# Patient Record
Sex: Female | Born: 1950
Health system: Southern US, Community
[De-identification: ages and names within clinical notes are randomized; demographics above are authoritative.]

## PROBLEM LIST (undated history)

## (undated) DIAGNOSIS — C801 Malignant (primary) neoplasm, unspecified: Secondary | ICD-10-CM

## (undated) DIAGNOSIS — G43909 Migraine, unspecified, not intractable, without status migrainosus: Secondary | ICD-10-CM

## (undated) DIAGNOSIS — T7840XA Allergy, unspecified, initial encounter: Secondary | ICD-10-CM

## (undated) DIAGNOSIS — M8580 Other specified disorders of bone density and structure, unspecified site: Secondary | ICD-10-CM

## (undated) DIAGNOSIS — Z85828 Personal history of other malignant neoplasm of skin: Secondary | ICD-10-CM

## (undated) DIAGNOSIS — N200 Calculus of kidney: Secondary | ICD-10-CM

## (undated) DIAGNOSIS — F0280 Dementia in other diseases classified elsewhere without behavioral disturbance: Secondary | ICD-10-CM

## (undated) HISTORY — DX: Migraine, unspecified, not intractable, without status migrainosus: G43.909

## (undated) HISTORY — DX: Dementia in other diseases classified elsewhere, unspecified severity, without behavioral disturbance, psychotic disturbance, mood disturbance, and anxiety: F02.80

## (undated) HISTORY — DX: Malignant (primary) neoplasm, unspecified: C80.1

## (undated) HISTORY — DX: Personal history of other malignant neoplasm of skin: Z85.828

## (undated) HISTORY — PX: COLONOSCOPY: SHX174

## (undated) HISTORY — DX: Calculus of kidney: N20.0

## (undated) HISTORY — DX: Allergy, unspecified, initial encounter: T78.40XA

## (undated) HISTORY — DX: Other specified disorders of bone density and structure, unspecified site: M85.80

---
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
## 2002-01-23 ENCOUNTER — Encounter: Payer: Self-pay | Admitting: Obstetrics and Gynecology

## 2002-01-23 ENCOUNTER — Encounter: Admission: RE | Admit: 2002-01-23 | Discharge: 2002-01-23 | Payer: Self-pay | Admitting: Obstetrics and Gynecology

## 2002-03-12 ENCOUNTER — Encounter: Admission: RE | Admit: 2002-03-12 | Discharge: 2002-03-12 | Payer: Self-pay | Admitting: Obstetrics and Gynecology

## 2002-03-12 ENCOUNTER — Encounter: Payer: Self-pay | Admitting: Obstetrics and Gynecology

## 2002-04-22 ENCOUNTER — Ambulatory Visit (HOSPITAL_COMMUNITY): Admission: RE | Admit: 2002-04-22 | Discharge: 2002-04-22 | Payer: Self-pay | Admitting: Obstetrics and Gynecology

## 2002-04-22 ENCOUNTER — Encounter (INDEPENDENT_AMBULATORY_CARE_PROVIDER_SITE_OTHER): Payer: Self-pay | Admitting: *Deleted

## 2003-02-09 ENCOUNTER — Other Ambulatory Visit: Admission: RE | Admit: 2003-02-09 | Discharge: 2003-02-09 | Payer: Self-pay | Admitting: Obstetrics and Gynecology

## 2003-06-01 ENCOUNTER — Encounter: Admission: RE | Admit: 2003-06-01 | Discharge: 2003-06-01 | Payer: Self-pay | Admitting: Internal Medicine

## 2004-02-16 ENCOUNTER — Other Ambulatory Visit: Admission: RE | Admit: 2004-02-16 | Discharge: 2004-02-16 | Payer: Self-pay | Admitting: Obstetrics and Gynecology

## 2005-03-09 ENCOUNTER — Other Ambulatory Visit: Admission: RE | Admit: 2005-03-09 | Discharge: 2005-03-09 | Payer: Self-pay | Admitting: Obstetrics and Gynecology

## 2005-08-01 ENCOUNTER — Encounter: Admission: RE | Admit: 2005-08-01 | Discharge: 2005-08-01 | Payer: Self-pay | Admitting: Internal Medicine

## 2006-03-15 ENCOUNTER — Other Ambulatory Visit: Admission: RE | Admit: 2006-03-15 | Discharge: 2006-03-15 | Payer: Self-pay | Admitting: Obstetrics and Gynecology

## 2006-07-31 ENCOUNTER — Emergency Department (HOSPITAL_COMMUNITY): Admission: EM | Admit: 2006-07-31 | Discharge: 2006-07-31 | Payer: Self-pay | Admitting: Emergency Medicine

## 2006-09-19 ENCOUNTER — Encounter: Admission: RE | Admit: 2006-09-19 | Discharge: 2006-09-19 | Payer: Self-pay | Admitting: Obstetrics and Gynecology

## 2007-03-26 ENCOUNTER — Other Ambulatory Visit: Admission: RE | Admit: 2007-03-26 | Discharge: 2007-03-26 | Payer: Self-pay | Admitting: Obstetrics and Gynecology

## 2007-08-19 LAB — HM COLONOSCOPY

## 2007-11-07 ENCOUNTER — Encounter: Admission: RE | Admit: 2007-11-07 | Discharge: 2007-11-07 | Payer: Self-pay | Admitting: Obstetrics and Gynecology

## 2008-03-30 ENCOUNTER — Other Ambulatory Visit: Admission: RE | Admit: 2008-03-30 | Discharge: 2008-03-30 | Payer: Self-pay | Admitting: Obstetrics and Gynecology

## 2009-02-22 ENCOUNTER — Encounter: Admission: RE | Admit: 2009-02-22 | Discharge: 2009-02-22 | Payer: Self-pay | Admitting: Obstetrics and Gynecology

## 2010-04-20 ENCOUNTER — Encounter: Admission: RE | Admit: 2010-04-20 | Discharge: 2010-04-20 | Payer: Self-pay | Admitting: Obstetrics and Gynecology

## 2010-10-28 NOTE — Op Note (Signed)
NAME:  Joanne Brock, Joanne Brock                         ACCOUNT NO.:  [id number]   MEDICAL RECORD NO.:  [id number]                   PATIENT TYPE:  AMB   LOCATION:  SDC                                  FACILITY:  [facility]   PHYSICIAN:  Artist Pais, M.D.                 DATE OF BIRTH:  05-01-1951   DATE OF PROCEDURE:  04/22/2002  DATE OF DISCHARGE:                                 OPERATIVE REPORT   PREOPERATIVE DIAGNOSES:  1. Postmenopausal bleeding.  2. Endometrial polyps on sonohysterogram.   POSTOPERATIVE DIAGNOSES:  1. Postmenopausal bleeding.  2. Endometrial polyps on sonohysterogram.   PROCEDURE:  1. Dilatation and curettage.  2. Attempted hysteroscopy.  3. Polypectomy.   SURGEON:  Artist Pais, M.D.   ESTIMATED BLOOD LOSS:  Minimal.   ANESTHESIA:  MAC plus 10 cc 1% lidocaine paracervical block.   COMPLICATIONS:  None.   DRAINS:  None.   DESCRIPTION OF OPERATION:  The patient was brought to the operating room,  identified on the operating room table.  After the patient was adequately  sedated using monitored anesthesia care, she was placed in the dorsal  lithotomy position and prepped and draped in the usual sterile fashion.  The  bladder was straight catheterized for approximately 150 cc of clear yellow  urine.  Examination under anesthesia revealed the uterus to be slightly  retroverted, normal size, approximately 6 weeks without any adnexal mass  palpated.  The speculum was placed and the posterior lip of the cervix was  grasped with a single tooth tenaculum after infiltrating it with 1 cc of 1%  lidocaine.  The remaining 9 cc of 1% lidocaine were placed for a  paracervical block.  However, even though the uterus was slightly  retroverted, the endometrial canal tracked in the anteverted fashion and I  was able to easily dilate the patient to a number 23 Pratt dilator.  Subsequent to that, though, I was unable to place the [facility] hysteroscope and  then again dilated up to  a number 25 Pratt dilator.  However, the [facility]I  hysteroscope kept catching on a ridge of cervical tissue on the right and  after several attempts it was deemed by this physician to be unsafe to  continue to try to place the scope despite the fact that the cervix was  adequately dilated.  The scope did continue to catch on this particular  small cervical ridge and I was unable to flatten out this ridge at all.  Thus, the decision was made to discontinue the hysteroscopy attempt and to  curette the uterus.  The uterus was subsequently curetted in a systematic  clockwise fashion with tissue obtained.  I was able to obtain tissue  throughout the entire uterus and at the end a good cry was heard all around.  The tissue was sent to pathology for examination.  At that point the  procedure was then terminated.  The patient tolerated procedure well without  apparent complications and was transferred to the recovery room in stable  condition after all instrument, sponge, needle counts were correct.  The  patient was noted to not be bleeding from the tenaculum site or from the  cervix lidocaine paracervical block sites.  She was subsequently given a  postoperative instruction sheet, urged to take Tylenol or ibuprofen for  pain, to return to the office in two to three weeks for a postoperative  evaluation.  She is to call with any problems.                                               Artist Pais, M.D.    DC/MEDQ  D:  04/22/2002  T:  04/22/2002  Job:  [id number]   cc:   Theressa Millard, M.D.  [address] [place]  [place] [address]  Fax: [phone]

## 2010-10-28 NOTE — H&P (Signed)
NAME:  Joanne Brock, Joanne Brock                         ACCOUNT NO.:  [id number]   MEDICAL RECORD NO.:  [id number]                   PATIENT TYPE:  AMB   LOCATION:  SDC                                  FACILITY:  [facility]   PHYSICIAN:  Artist Pais, M.D.                 DATE OF BIRTH:  23-Aug-1950   DATE OF ADMISSION:  DATE OF DISCHARGE:                                HISTORY & PHYSICAL   HISTORY OF PRESENT ILLNESS:  The patient is a very nice 61 year old gravida  1, para 1, Caucasian female referred via the courtesy of Dr. Theressa Millard.  The patient was initially seen January 09, 2002. She had been taking CombiPatch  for hormone replacement therapy but manifested bleeding on her CombiPatch  every three months for the last several years. Subsequently due to her  postmenopausal bleeding, she underwent a pelvic ultrasound which showed a  focal area of fundal thickening up to 12 mm in length toward the fundal  region. The remainder of the endometrium appeared to be very thin. The  patient subsequently underwent  sonohysterogram because the question is  raised of a small endometrial polyp on ultrasound. Sonohysterogram showed a  focal endometrial polyp along the posterior wall in the lower uterine  segment measuring 5 x 2 mm. In addition, there was a plaquelike area of a  symmetric thickening involving the endometrium along the anterior uterine  wall measuring 3 mm in thickness. The patient was advised to undergo D&C  hysteroscopy to remove these polyps. Risks of surgery including anesthetic  complications, hemorrhage, infection, damage to adjacent structures  including bladder, bowel, blood vessels and ureters were discussed with the  patient and she was made aware of the risk in uterine perforation which  could result in overwhelming life threatening hemorrhage requiring emergent  hysterectomy or uterine perforation which could result in bowel damage  requiring emergent colostomy which could result in  overwhelming life  threatening peritonitis. She expresses understanding of and acceptance of  these risks and desires to proceed with surgery.   OB/GYN HISTORY:  Menarche age 41, menopause at the age of 87, no history of  abnormal Pap or sexually transmitted disease.   PAST MEDICAL HISTORY:  History of migraine headaches initially moderated by  the CombiPatch but migraines have recurred recently.   ALLERGIES:  No known drug allergies.   CURRENT MEDICATIONS:  CombiPatch.   PAST SURGICAL HISTORY:  None.   FAMILY HISTORY:  There is no family history of colon, breast, ovarian or  prostate cancer. The patient's mother is 80 with skin cancer and  hypertension. She has some type of dementia and memory impairment thought to  be due to Alzheimer's and mini strokes. Her father died at 86 with senile  dementia. One sister age 80, alive and well. One child age 67, alive and  well.   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 She  does not use tobacco or alcohol.   REVIEW OF SYMPTOMS:  Noncontributory except as noted above. Denies headache,  visual changes, chest pain, shortness of breath, abdominal pain, change in  bowel habits, unintentional weight loss, dysuria, urgency, frequency,  vaginal pruritus or discharge, pain or bleeding with intercourse.   PHYSICAL EXAMINATION:  GENERAL:  A well-developed Caucasian female.  VITAL SIGNS:  Blood pressure 106/70, heart rate 76, weight 137, height 5  feet, 3 3/4 inches.  HEENT:  Normal.  NECK:  Supple without thyromegaly, adenopathy or nodules.  CHEST:  Clear to auscultation.  BREASTS:  Symmetrical without masses, nodes, dimpling, retraction or nipple  discharge.  CARDIAC:  Regular rate and rhythm without obvious murmur.  ABDOMEN:  Soft, nontender. No hepatosplenomegaly or masses.  EXTREMITIES:  No cyanosis, clubbing or edema.  NEUROLOGIC:  Oriented x 3, grossly normal.  PELVIC:  Normal external female genitalia. No  vulvar, vaginal or cervical  lesions. Pap smear with cyto brush __________ was performed on initial visit  found to be within normal limits. Bimanual examination revealed the uterus  to be normal , mobile, nontender without any adnexal mass palpated.  RECTAL:  Excellent sphincter tone. No masses palpated.   ASSESSMENT/PLAN:  The patient is a 60 year old Caucasian female, gravida 1,  para 1 with postmenopausal bleeding on workup who was found to have  plaquelike thickening on the anterior uterine wall and also an endometrial  polyp on the posterior wall. She is admitted for a D&C hysteroscopy and  polypectomy. Risks have been discussed with the patient and she expresses  understanding of and acceptance of these risks and desires to proceed with  surgery.                                               Artist Pais, M.D.    DC/MEDQ  D:  04/21/2002  T:  04/22/2002  Job:  [id number]   cc:   Theressa Millard, M.D.  [address] [place]  [place] [address]  Fax: [phone]   Day Surgery at [facility]

## 2011-03-14 ENCOUNTER — Other Ambulatory Visit: Payer: Self-pay | Admitting: Obstetrics and Gynecology

## 2011-03-14 DIAGNOSIS — Z1231 Encounter for screening mammogram for malignant neoplasm of breast: Secondary | ICD-10-CM

## 2011-04-24 ENCOUNTER — Ambulatory Visit: Payer: Self-pay

## 2011-04-27 ENCOUNTER — Ambulatory Visit
Admission: RE | Admit: 2011-04-27 | Discharge: 2011-04-27 | Disposition: A | Discharge status: Home / Self Care | Payer: BC Managed Care – PPO | Source: Ambulatory Visit | Attending: Obstetrics and Gynecology | Admitting: Obstetrics and Gynecology

## 2011-04-27 DIAGNOSIS — Z1231 Encounter for screening mammogram for malignant neoplasm of breast: Secondary | ICD-10-CM

## 2012-03-19 ENCOUNTER — Other Ambulatory Visit: Payer: Self-pay | Admitting: Obstetrics and Gynecology

## 2012-03-19 DIAGNOSIS — Z1231 Encounter for screening mammogram for malignant neoplasm of breast: Secondary | ICD-10-CM

## 2012-05-02 ENCOUNTER — Ambulatory Visit
Admission: RE | Admit: 2012-05-02 | Discharge: 2012-05-02 | Disposition: A | Discharge status: Home / Self Care | Payer: BC Managed Care – PPO | Source: Ambulatory Visit | Attending: Obstetrics and Gynecology | Admitting: Obstetrics and Gynecology

## 2012-05-02 DIAGNOSIS — Z1231 Encounter for screening mammogram for malignant neoplasm of breast: Secondary | ICD-10-CM

## 2013-04-18 ENCOUNTER — Other Ambulatory Visit: Payer: Self-pay

## 2013-04-18 DIAGNOSIS — Z1231 Encounter for screening mammogram for malignant neoplasm of breast: Secondary | ICD-10-CM

## 2013-05-19 ENCOUNTER — Ambulatory Visit
Admission: RE | Admit: 2013-05-19 | Discharge: 2013-05-19 | Disposition: A | Discharge status: Home / Self Care | Payer: BC Managed Care – PPO | Source: Ambulatory Visit

## 2013-05-19 DIAGNOSIS — Z1231 Encounter for screening mammogram for malignant neoplasm of breast: Secondary | ICD-10-CM

## 2013-05-20 ENCOUNTER — Ambulatory Visit: Payer: BC Managed Care – PPO

## 2014-03-24 ENCOUNTER — Ambulatory Visit (INDEPENDENT_AMBULATORY_CARE_PROVIDER_SITE_OTHER): Payer: BC Managed Care – PPO

## 2014-03-24 VITALS — BP 112/68 | HR 74 | Resp 17

## 2014-03-24 DIAGNOSIS — B351 Tinea unguium: Secondary | ICD-10-CM

## 2014-03-24 DIAGNOSIS — L603 Nail dystrophy: Secondary | ICD-10-CM

## 2014-03-24 DIAGNOSIS — M79675 Pain in left toe(s): Secondary | ICD-10-CM

## 2014-03-24 MED ORDER — CEPHALEXIN 500 MG PO CAPS: 500.0000 mg | ORAL_CAPSULE | Freq: Three times a day (TID) | ORAL | Status: DC

## 2014-03-24 NOTE — Patient Instructions (Addendum)
Betadine Soak Instructions  Purchase an 8 oz. bottle of BETADINE solution (Povidone)  THE DAY AFTER THE PROCEDURE  Place 1 tablespoon of betadine solution in a quart of warm tap water.  Submerge your foot or feet with outer bandage intact for the initial soak; this will allow the bandage to become moist and wet for easy lift off.  Once you remove your bandage, continue to soak in the solution for 20 minutes.  This soak should be done twice a day.  Next, remove your foot or feet from solution, blot dry the affected area and cover.  You may use a band aid large enough to cover the area or use gauze and tape.  Apply other medications to the area as directed by the doctor such as cortisporin otic solution (ear drops) or neosporin.  IF YOUR SKIN BECOMES IRRITATED WHILE USING THESE INSTRUCTIONS, IT IS OKAY TO SWITCH TO EPSOM SALTS AND WATER OR WHITE VINEGAR AND WATER.  For postoperative pain recommend 2 Tylenol 3 times daily as needed for pain or Aleve twice daily as needed for pain

## 2014-03-24 NOTE — Progress Notes (Signed)
   Subjective:    Patient ID: Joanne Brock, female    DOB: May 20, 1951, 63 y.o.   MRN: [id number]  HPI  Pt presents with great left toenail fungus, this has been an ongoing problem for her, she has tried medications with no results. Wants to have nail permenantly removed  Review of Systems  All other systems reviewed and are negative.      Objective:   Physical Exam 63 year old white female well-developed well-nourished oriented x3 presents at this time requesting permanent nail excision left great toe she's had multiple treatments times for fungus nails as a trauma to the nail beds and the inserts nails thick criptotic discolored yellowed and brittle incurvated in ingrowing and tender with enclosed shoes and activities patient wished to have it removed permanently. Neurovascular status is intact pedal pulses are palpable DP and PT +2/4 bilateral capillary refill time 3 seconds. Epicritic and proprioceptive sensations intact and symmetric bilateral. Neurologically epicritic and proprioceptive sensations intact normal plantar response and DTRs noted. Dermatologically skin color pigment normal hair growth absent diminished distally nails normal except for the left hallux which is thick and mycotic brittle crumbly and friable incurvated tender on palpation. No secondary infection no discharge or drainage no purulence or no pain on palpation and direct compression is noted. Remainder of exam unremarkable rectus foot mild digital contractures flexible nature no open wounds or ulcers no secondary infections       Assessment & Plan:  Assessment onychomycosis the nail dystrophy and pain of the left hallux nail plate and nailbed plan at this time per patient request my recommendation permanent nail excision and phenol matricectomy local block is administered Betadine prep performed the nail plate is avulsed feel application x3 of alcohol wash Silvadene cream and gauze dressing being applied. Patient is  given instructions for daily soap and Betadine or Epsom salts Rickman Tylenol or ibuprofen as needed for pain also recheck in 2-3 weeks for followup for nail check. Patient placed on a regimen of antibiotics cephalexin 500 mg 3 times a day x7 days begin Neosporin and Band-Aid dressings daily as instructed next progress Joanne Brock DPM

## 2014-04-03 ENCOUNTER — Telehealth: Payer: Self-pay | Admitting: *Deleted

## 2014-04-03 NOTE — Telephone Encounter (Signed)
Toe seems to be healing just fine the outer edges of the toe seem to be good, but noticed on the bandaid today in the middle of the toe seems to have blood on it , is it normal? Spoke to patient told her that was normal as long as her toe did not look inflamed red, green or yellow pus. No heat to the toe. Patient will continue to soak the toe until she follows up with dr Blenda Mounts

## 2014-04-14 ENCOUNTER — Ambulatory Visit (INDEPENDENT_AMBULATORY_CARE_PROVIDER_SITE_OTHER): Payer: BC Managed Care – PPO

## 2014-04-14 VITALS — BP 112/65 | HR 66 | Resp 13

## 2014-04-14 DIAGNOSIS — B351 Tinea unguium: Secondary | ICD-10-CM

## 2014-04-14 DIAGNOSIS — Z09 Encounter for follow-up examination after completed treatment for conditions other than malignant neoplasm: Secondary | ICD-10-CM

## 2014-04-14 DIAGNOSIS — M79675 Pain in left toe(s): Secondary | ICD-10-CM

## 2014-04-14 NOTE — Patient Instructions (Signed)

## 2014-04-14 NOTE — Progress Notes (Signed)
   Subjective:    Patient ID: OAKLEY ORBAN, female    DOB: 08/19/1950, 63 y.o.   MRN: [id number]  HPI Comments: Pt states she had bleeding up until yesterday, but now only oozing.  Pt states she has switched to white vinegar 1/4 C to 1 Qt of water.     Review of Systemsno new findings or systemic changes noted     Objective:   Physical Exam Neurovascular status is intact pedal pulses are palpable patient been soaking switched over to vinegar from Betadine there is still was a little bleeding up until yellowed day still mild serous drainage present nail bed clean serous drainage noted is no malodor no ascending psoas with meningitis no signs of infection no signs of regrowth her spicule of nail      Assessment & Plan:  Assessment good postop progress following AP nail procedure continue with soaking once daily lungs or drainage keep Neosporin and Band-Aid on during daily or dry at night discharge to an as-needed basis for follow-up does not resolve within a month next  [employer] DPM

## 2015-06-08 ENCOUNTER — Other Ambulatory Visit (HOSPITAL_COMMUNITY): Payer: Self-pay | Admitting: Otolaryngology

## 2015-06-08 DIAGNOSIS — H93A3 Pulsatile tinnitus, bilateral: Secondary | ICD-10-CM

## 2015-06-10 ENCOUNTER — Other Ambulatory Visit (HOSPITAL_COMMUNITY): Payer: Self-pay | Admitting: Otolaryngology

## 2015-06-10 DIAGNOSIS — R011 Cardiac murmur, unspecified: Secondary | ICD-10-CM

## 2015-06-11 ENCOUNTER — Ambulatory Visit (HOSPITAL_COMMUNITY)
Admission: RE | Admit: 2015-06-11 | Discharge: 2015-06-11 | Disposition: A | Discharge status: Home / Self Care | Payer: BLUE CROSS/BLUE SHIELD | Source: Ambulatory Visit | Attending: Cardiovascular Disease | Admitting: Cardiovascular Disease

## 2015-06-11 ENCOUNTER — Ambulatory Visit (HOSPITAL_BASED_OUTPATIENT_CLINIC_OR_DEPARTMENT_OTHER)
Admission: RE | Admit: 2015-06-11 | Discharge: 2015-06-11 | Disposition: A | Discharge status: Home / Self Care | Payer: BLUE CROSS/BLUE SHIELD | Source: Ambulatory Visit | Attending: Cardiovascular Disease | Admitting: Cardiovascular Disease

## 2015-06-11 DIAGNOSIS — I071 Rheumatic tricuspid insufficiency: Secondary | ICD-10-CM | POA: Diagnosis not present

## 2015-06-11 DIAGNOSIS — I6523 Occlusion and stenosis of bilateral carotid arteries: Secondary | ICD-10-CM | POA: Diagnosis not present

## 2015-06-11 DIAGNOSIS — R011 Cardiac murmur, unspecified: Secondary | ICD-10-CM

## 2015-06-11 DIAGNOSIS — I340 Nonrheumatic mitral (valve) insufficiency: Secondary | ICD-10-CM | POA: Insufficient documentation

## 2015-06-11 DIAGNOSIS — H93A3 Pulsatile tinnitus, bilateral: Secondary | ICD-10-CM | POA: Diagnosis not present

## 2016-04-11 LAB — LIPID PANEL
Cholesterol: 245 — AB (ref 0–200)
Cholesterol: 4 (ref 0–200)
HDL: 67 (ref 35–70)
HDL: 67 (ref 35–70)
LDL Cholesterol: 161
LDL Cholesterol: 161
Triglycerides: 85 (ref 40–160)
Triglycerides: 85 (ref 40–160)

## 2016-04-11 LAB — CBC AND DIFFERENTIAL
HCT: 39 (ref 36–46)
HCT: 39 (ref 36–46)
HEMATOCRIT: 39 (ref 36–46)
Hemoglobin: 13.1 (ref 12.0–16.0)
Hemoglobin: 13.1 (ref 12.0–16.0)
Hemoglobin: 13.1 (ref 12.0–16.0)
PLATELETS: 199 (ref 150–399)
Platelets: 199 (ref 150–399)
Platelets: 199 (ref 150–399)
WBC: 6
WBC: 6
WBC: 6

## 2016-04-11 LAB — BASIC METABOLIC PANEL
BUN: 12 (ref 4–21)
Creatinine: 0.8 (ref 0.5–1.1)
GLUCOSE: 87
POTASSIUM: 3.8 (ref 3.4–5.3)
Sodium: 140 (ref 137–147)

## 2016-04-11 LAB — BASIC METABOLIC PANEL WITH GFR
BUN: 12 (ref 4–21)
Creatinine: 0.8 (ref 0.5–1.1)
Glucose: 87
Potassium: 3.8 (ref 3.4–5.3)
Sodium: 140 (ref 137–147)

## 2016-04-11 LAB — HEPATIC FUNCTION PANEL
ALT: 12 (ref 7–35)
ALT: 12 (ref 7–35)
ALT: 12 (ref 7–35)
AST: 23 (ref 13–35)
AST: 23 (ref 13–35)
AST: 23 (ref 13–35)
Alkaline Phosphatase: 74 (ref 25–125)

## 2016-04-11 LAB — VITAMIN D 25 HYDROXY (VIT D DEFICIENCY, FRACTURES): VIT D 25 HYDROXY: 41.3

## 2016-07-24 LAB — HM PAP SMEAR: HM Pap smear: NEGATIVE

## 2016-07-24 LAB — HM MAMMOGRAPHY

## 2016-07-27 ENCOUNTER — Other Ambulatory Visit: Payer: Self-pay | Admitting: Obstetrics and Gynecology

## 2016-07-27 DIAGNOSIS — N951 Menopausal and female climacteric states: Secondary | ICD-10-CM

## 2016-08-22 ENCOUNTER — Ambulatory Visit
Admission: RE | Admit: 2016-08-22 | Discharge: 2016-08-22 | Disposition: A | Discharge status: Home / Self Care | Payer: BLUE CROSS/BLUE SHIELD | Source: Ambulatory Visit | Attending: Obstetrics and Gynecology | Admitting: Obstetrics and Gynecology

## 2016-08-22 DIAGNOSIS — N951 Menopausal and female climacteric states: Secondary | ICD-10-CM

## 2016-08-22 LAB — HM DEXA SCAN

## 2017-02-08 ENCOUNTER — Encounter: Payer: Self-pay | Admitting: Family Medicine

## 2017-02-08 ENCOUNTER — Ambulatory Visit (INDEPENDENT_AMBULATORY_CARE_PROVIDER_SITE_OTHER): Payer: BLUE CROSS/BLUE SHIELD | Admitting: Family Medicine

## 2017-02-08 VITALS — BP 124/78 | HR 81 | Temp 98.3°F | Ht 63.5 in | Wt 118.8 lb

## 2017-02-08 DIAGNOSIS — E559 Vitamin D deficiency, unspecified: Secondary | ICD-10-CM

## 2017-02-08 DIAGNOSIS — E785 Hyperlipidemia, unspecified: Secondary | ICD-10-CM

## 2017-02-08 DIAGNOSIS — M5412 Radiculopathy, cervical region: Secondary | ICD-10-CM

## 2017-02-08 DIAGNOSIS — Z86010 Personal history of colonic polyps: Secondary | ICD-10-CM

## 2017-02-08 DIAGNOSIS — Z85828 Personal history of other malignant neoplasm of skin: Secondary | ICD-10-CM | POA: Insufficient documentation

## 2017-02-08 DIAGNOSIS — G43909 Migraine, unspecified, not intractable, without status migrainosus: Secondary | ICD-10-CM | POA: Insufficient documentation

## 2017-02-08 DIAGNOSIS — G43109 Migraine with aura, not intractable, without status migrainosus: Secondary | ICD-10-CM

## 2017-02-08 MED ORDER — PREDNISONE 20 MG PO TABS: ORAL_TABLET | ORAL | 0 refills | Status: DC

## 2017-02-08 NOTE — Progress Notes (Addendum)
Phone: [phone]  Subjective:  Patient presents today to establish care.  Prior patient of [facility] Dr. Maxwell Caul has been seen at [facility] by other providers since then. Chief complaint-noted.   See problem oriented charting  The following were reviewed and entered/updated in epic: Past Medical History:  Diagnosis Date  . History of skin cancer    squamous and basal x 8 in 2018. Dr. Renda Rolls. Had been with Dr. Tonia Brooms.   . Kidney stone    x1  . Migraines    Aura- smell, spots. once a month- sumatriptan. topamax 50mg  once a day prophylaxis.    Patient Active Problem List   Diagnosis Date Noted  . Hyperlipidemia 02/08/2017  . History of skin cancer   . Migraines    Past Surgical History:  Procedure Laterality Date  . TONSILLECTOMY Bilateral 1960    Family History  Problem Relation Age of Onset  . Hypertension Mother   . Stroke Mother        TIAs- declined over years and stroke eventually. died 22  . Other Father        decline after ? heat stroke. died around 53  . Stroke Sister        41. nonsmoker.   Marland Kitchen Ulcerative colitis Daughter     Medications- reviewed and updated Current Outpatient Prescriptions  Medication Sig Dispense Refill  . cholecalciferol (VITAMIN D) 1000 UNITS tablet Take 1,000 Units by mouth daily.    . Coenzyme Q10 (CO Q 10) 10 MG CAPS Take 10 mg by mouth.    Vladimir Faster Glyc-Propyl Glyc PF (SYSTANE ULTRA PF) 0.4-0.3 % SOLN Apply 1 drop topically daily.    . rizatriptan (MAXALT) 10 MG tablet Take 10 mg by mouth as needed for migraine. May repeat in 2 hours if needed    . topiramate (TOPAMAX) 50 MG tablet Take 50 mg by mouth 2 (two) times daily.    . predniSONE (DELTASONE) 20 MG tablet Take 1 tablet by mouth daily for 3 days, then 1/2 tablet daily for 4 days 5 tablet 0   No current facility-administered medications for this visit.     Allergies-reviewed and updated No Known Allergies  Social History   Social History  . Marital  status: Married    Spouse name: N/A  . Number of children: N/A  . Years of education: N/A   Social History Main Topics  . Smoking status: Never Smoker  . Smokeless tobacco: Never Used  . Alcohol use No  . Drug use: No  . Sexual activity: Yes   Other Topics Concern  . None   Social History Narrative   Married over 40 years in 2018. 1 daughter 7 years old in 2018. No grandkids- 2 grandpets.    Had to spend great deal of time caring for aging parents- now passed.       Undergrad at [employer].    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 [employer]: travel       ROS--Full ROS was completed Review of Systems  Constitutional: Negative for chills and fever.  HENT: Negative for hearing loss and tinnitus.   Eyes: Negative for blurred vision and double vision.  Respiratory: Negative for cough and hemoptysis.   Cardiovascular: Negative for chest pain and palpitations.  Gastrointestinal: Negative for heartburn and nausea.  Genitourinary: Negative for dysuria and urgency.  Musculoskeletal: Positive for neck pain. Negative for myalgias.  Skin: Negative for itching and rash.  Neurological: Positive for sensory change (numbness/tingling right hand/forearm).  Endo/Heme/Allergies: Negative for polydipsia. Does not bruise/bleed easily.  Psychiatric/Behavioral: Negative for substance abuse and suicidal ideas.   Objective: BP 124/78 (BP Location: Left Arm, Patient Position: Sitting, Cuff Size: Normal)   Pulse 81   Temp 98.3 F (36.8 C) (Oral)   Ht 5' 3.5" (1.613 m)   Wt 118 lb 12.8 oz (53.9 kg)   SpO2 95%   BMI 20.71 kg/m  Gen: NAD, resting comfortably HEENT: Mucous membranes are moist. Oropharynx normal. TM normal. Eyes: sclera and lids normal, PERRLA Neck: no thyromegaly, no cervical lymphadenopathy CV: RRR no murmurs rubs or gallops Lungs: CTAB no crackles, wheeze, rhonchi Abdomen: soft/nontender/nondistended/normal bowel sounds. No rebound or guarding.  Ext:  no edema Skin: warm, dry Neuro: 5/5 strength in upper and lower extremities, normal gait, normal reflexes spurling negative  Assessment/Plan:  Right Cervical radiculopathy S: twinges in her neck about 2 weeks ago (can be moderate- heat helps slightly) . Started feeling some intermittent numbness/tingling into her right hand and right forearm. No weakness. Not dropping objects.   Long term issues with right foot falling asleep at night. Likely unrelated A/P: right neck pain with right hand numbness/tingling likely cervical in origin. will trial low dose prednisone trial to see if this helps her   Hyperlipidemia S: has had elevated lipids for years.  A/P: last new physician- recommended starting statin. We will need to get records and calculate risk for ascvd  Physical 1 year from last physical  Meds ordered this encounter  Medications  . Coenzyme Q10 (CO Q 10) 10 MG CAPS    Sig: Take 10 mg by mouth.  Vladimir Faster Glyc-Propyl Glyc PF (SYSTANE ULTRA PF) 0.4-0.3 % SOLN    Sig: Apply 1 drop topically daily.  Marland Kitchen DISCONTD: Cholecalciferol (VITAMIN D-1000 MAX ST) 1000 units tablet    Sig: Take by mouth.  . predniSONE (DELTASONE) 20 MG tablet    Sig: Take 1 tablet by mouth daily for 3 days, then 1/2 tablet daily for 4 days    Dispense:  5 tablet    Refill:  0    Return precautions advised. Garret Reddish, MD

## 2017-02-08 NOTE — Patient Instructions (Addendum)
Sign release of information at the check out desk for PAP smear and mammogram from Dr. Marvel Plan  Sign release of information at the check out desk for [employer] physicians- all immunizations, colonoscopy report, labs and office visits for 3 years.   Trial prednisone  Will get records. Check in 1 month with [place] if you haven't heard anything.

## 2017-02-08 NOTE — Assessment & Plan Note (Signed)
S: has had elevated lipids for years.  A/P: last new physician- recommended starting statin. We will need to get records and calculate risk for ascvd

## 2017-02-16 ENCOUNTER — Encounter: Payer: Self-pay | Admitting: Family Medicine

## 2017-02-20 ENCOUNTER — Encounter: Payer: Self-pay | Admitting: Family Medicine

## 2017-02-20 DIAGNOSIS — E559 Vitamin D deficiency, unspecified: Secondary | ICD-10-CM | POA: Insufficient documentation

## 2017-02-20 DIAGNOSIS — Z86010 Personal history of colonic polyps: Secondary | ICD-10-CM | POA: Insufficient documentation

## 2017-02-21 ENCOUNTER — Encounter: Payer: Self-pay | Admitting: Family Medicine

## 2017-03-14 ENCOUNTER — Telehealth: Payer: Self-pay | Admitting: *Deleted

## 2017-03-14 NOTE — Telephone Encounter (Signed)
I spoke with Dr. Yong Channel and we rescheduled her for a week later. She is scheduled for 04/03/2017

## 2017-03-14 NOTE — Telephone Encounter (Signed)
Called patient and spoke to her. She is on Doxycycline due to a stye she has developed in her eye. She wants to make sure that her flu shot will not interact with her Doxycycline.

## 2017-03-14 NOTE — Telephone Encounter (Signed)
Patient called and states she is on Doxycycline from another provided and she is wondering if she can get her flu shot.She has an upcoming appt to get one . Patient is requesting a call back. Her contact number is [phone]. Please advise. Thank you

## 2017-03-27 ENCOUNTER — Ambulatory Visit: Payer: BLUE CROSS/BLUE SHIELD

## 2017-04-03 ENCOUNTER — Ambulatory Visit (INDEPENDENT_AMBULATORY_CARE_PROVIDER_SITE_OTHER): Payer: BLUE CROSS/BLUE SHIELD

## 2017-04-03 DIAGNOSIS — Z23 Encounter for immunization: Secondary | ICD-10-CM

## 2017-04-16 ENCOUNTER — Encounter: Payer: Self-pay | Admitting: Family Medicine

## 2017-04-16 ENCOUNTER — Ambulatory Visit (INDEPENDENT_AMBULATORY_CARE_PROVIDER_SITE_OTHER): Payer: BLUE CROSS/BLUE SHIELD | Admitting: Family Medicine

## 2017-04-16 VITALS — BP 120/76 | HR 77 | Temp 97.6°F | Ht 63.5 in | Wt 115.8 lb

## 2017-04-16 DIAGNOSIS — Z0000 Encounter for general adult medical examination without abnormal findings: Secondary | ICD-10-CM

## 2017-04-16 DIAGNOSIS — E785 Hyperlipidemia, unspecified: Secondary | ICD-10-CM

## 2017-04-16 DIAGNOSIS — G43109 Migraine with aura, not intractable, without status migrainosus: Secondary | ICD-10-CM

## 2017-04-16 DIAGNOSIS — Z114 Encounter for screening for human immunodeficiency virus [HIV]: Secondary | ICD-10-CM | POA: Diagnosis not present

## 2017-04-16 DIAGNOSIS — M85851 Other specified disorders of bone density and structure, right thigh: Secondary | ICD-10-CM

## 2017-04-16 DIAGNOSIS — Z1159 Encounter for screening for other viral diseases: Secondary | ICD-10-CM

## 2017-04-16 DIAGNOSIS — M8580 Other specified disorders of bone density and structure, unspecified site: Secondary | ICD-10-CM | POA: Insufficient documentation

## 2017-04-16 LAB — CBC
HCT: 36.6 % (ref 36.0–46.0)
Hemoglobin: 12.2 g/dL (ref 12.0–15.0)
MCHC: 33.4 g/dL (ref 30.0–36.0)
MCV: 87.9 fl (ref 78.0–100.0)
Platelets: 211 10*3/uL (ref 150.0–400.0)
RBC: 4.16 Mil/uL (ref 3.87–5.11)
RDW: 13.4 % (ref 11.5–15.5)
WBC: 5 10*3/uL (ref 4.0–10.5)

## 2017-04-16 LAB — COMPREHENSIVE METABOLIC PANEL
ALT: 10 U/L (ref 0–35)
AST: 20 U/L (ref 0–37)
Albumin: 4.5 g/dL (ref 3.5–5.2)
Alkaline Phosphatase: 67 U/L (ref 39–117)
BUN: 9 mg/dL (ref 6–23)
CALCIUM: 9.9 mg/dL (ref 8.4–10.5)
CHLORIDE: 105 meq/L (ref 96–112)
CO2: 27 meq/L (ref 19–32)
CREATININE: 0.77 mg/dL (ref 0.40–1.20)
GFR: 80.05 mL/min (ref >60.00)
GLUCOSE: 86 mg/dL (ref 70–99)
Potassium: 3.8 mEq/L (ref 3.5–5.1)
SODIUM: 140 meq/L (ref 135–145)
Total Bilirubin: 0.6 mg/dL (ref 0.2–1.2)
Total Protein: 6.8 g/dL (ref 6.0–8.3)

## 2017-04-16 LAB — LIPID PANEL
CHOLESTEROL: 228 mg/dL — AB (ref 0–200)
HDL: 64.8 mg/dL (ref >39.00)
LDL Cholesterol: 151 mg/dL — ABNORMAL HIGH (ref 0–99)
NonHDL: 163.52
Total CHOL/HDL Ratio: 4
Triglycerides: 64 mg/dL (ref 0.0–149.0)
VLDL: 12.8 mg/dL (ref 0.0–40.0)

## 2017-04-16 MED ORDER — RIZATRIPTAN BENZOATE 10 MG PO TABS: 10.0000 mg | ORAL_TABLET | ORAL | 11 refills | Status: DC | PRN

## 2017-04-16 NOTE — Assessment & Plan Note (Signed)
Migraines- uses maxalt prn every 1-2 months. topamax for prophylaxis

## 2017-04-16 NOTE — Assessment & Plan Note (Signed)
HLD- on no rx. 10 year risk 4.7% last year. Does take fish oil

## 2017-04-16 NOTE — Progress Notes (Addendum)
Phone: [phone]  Subjective:  Patient presents today for their annual physical. Chief complaint-noted.   See problem oriented charting- ROS- full  review of systems was completed and negative including No chest pain or shortness of breath. No headache or blurry vision.   The following were reviewed and entered/updated in epic: Past Medical History:  Diagnosis Date  . History of skin cancer    squamous and basal x 8 in 2018. Dr. Renda Rolls. Had been with Dr. Tonia Brooms.   . Kidney stone    x1  . Migraines    Aura- smell, spots. once a month- sumatriptan. topamax 50mg  once a day prophylaxis.    Patient Active Problem List   Diagnosis Date Noted  . History of adenomatous polyp of colon 02/20/2017    Priority: Medium  . Hyperlipidemia 02/08/2017    Priority: Medium  . Migraines     Priority: Medium  . Vitamin D deficiency 02/20/2017    Priority: Low  . History of skin cancer     Priority: Low  . Osteopenia 04/16/2017   Past Surgical History:  Procedure Laterality Date  . TONSILLECTOMY Bilateral 1960    Family History  Problem Relation Age of Onset  . Hypertension Mother   . Stroke Mother        TIAs- declined over years and stroke eventually. died 15  . Other Father        decline after ? heat stroke. died around 43  . Stroke Sister        16. nonsmoker.   Marland Kitchen Ulcerative colitis Daughter     Medications- reviewed and updated Current Outpatient Medications  Medication Sig Dispense Refill  . Calcium Carbonate-Vit D-Min (CALCIUM-VITAMIN D-MINERALS PO) Take 1 tablet daily by mouth.    . cholecalciferol (VITAMIN D) 1000 UNITS tablet Take 1,000 Units by mouth daily.    . Coenzyme Q10 (CO Q 10) 10 MG CAPS Take 10 mg by mouth.    . Omega-3 Fatty Acids (FISH OIL) 1000 MG CAPS Take 1 capsule daily by mouth.    Vladimir Faster Glyc-Propyl Glyc PF (SYSTANE ULTRA PF) 0.4-0.3 % SOLN Apply 1 drop topically daily.    . rizatriptan (MAXALT) 10 MG tablet Take 1 tablet (10 mg total) as  needed by mouth for migraine. May repeat in 2 hours if needed 10 tablet 11  . topiramate (TOPAMAX) 50 MG tablet Take 50 mg by mouth 2 (two) times daily.     No current facility-administered medications for this visit.     Allergies-reviewed and updated No Known Allergies  Social History   Socioeconomic History  . Marital status: Married    Spouse name: None  . Number of children: None  . Years of education: None  . Highest education level: None  Social Needs  . Financial resource strain: None  . Food insecurity - worry: None  . Food insecurity - inability: None  . Transportation needs - medical: None  . Transportation needs - non-medical: None  Occupational History  . None  Tobacco Use  . Smoking status: Never Smoker  . Smokeless tobacco: Never Used  Substance and Sexual Activity  . Alcohol use: No    Alcohol/week: 0.0 oz  . Drug use: No  . Sexual activity: Yes  Other Topics Concern  . None  Social History Narrative   Married over 40 years in 2018. 1 daughter 64 years old in 2018. No grandkids- 2 grandpets.    Had to spend great deal of time caring for  aging parents- now passed.       Undergrad at [employer].    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 Oropharynx normal Neck: no thyromegaly CV: RRR no murmurs rubs or gallops Lungs: CTAB no crackles, wheeze, rhonchi Abdomen: soft/nontender/nondistended/normal bowel sounds. No rebound or guarding.  Ext: no edema Skin: warm, dry Neuro: grossly normal, moves all extremities, PERRLA  Assessment/Plan:  66 y.o. Brock presenting for annual physical.  Health Maintenance counseling: 1.  Anticipatory guidance: Patient counseled regarding regular dental exams -q6 months, eye exams yearly at least, wearing seatbelts.  2. Risk factor reduction:  Advised patient of need for regular exercise and diet rich and fruits and vegetables to reduce risk of heart attack and stroke. Exercise- at least 3 days aweek. Diet-weight stable- balanced diet- different scales . Is low on meat in diet- takes b complex  Wt Readings from Last 3 Encounters:  04/16/17 115 lb 12.8 oz (52.5 kg)  02/08/17 118 lb 12.8 oz (53.9 kg)  3. Immunizations/screenings/ancillary studies- HCV and HIV screen today. States had zostavax- discussed shingrix likely at pharmacy once goes on medicare Immunization History  Administered Date(s) Administered  . Influenza,inj,Quad PF,6+ Mos 04/03/2017  . Influenza-Unspecified 04/11/2016  . Tdap 06/12/2012  4. Cervical cancer screening-  sees GYN yearly- get records. Has already signed 2 ROI per her- we will call to check in 5. Breast cancer screening-  breast exam with Dr. Norman Clay  and mammogram with Dr. Marvel Plan- get records 6. Colon cancer screening - 08/17/2007 with 10 year repeat as benign- repeat next year 7. Skin cancer screening- Dr. Renda Rolls at least once a year. advised regular sunscreen use. Denies worrisome, changing, or new skin lesions.  8. Osteoporosis screening at 26- will plan on next physical  Status of chronic or acute concerns   Osteopenia- at -2.3 right femur neck through GYN 08/22/16. On calcium/vitamin D  Hyperlipidemia HLD- on no rx. 10 year risk 4.7% last year. Does take fish oil  Migraines Migraines- uses maxalt prn every 1-2 months. topamax for prophylaxis  Return in about 1 year (around 04/16/2018) for physical.  Orders Placed This Encounter  Procedures  . CBC    [place]  . Comprehensive metabolic panel    [place]    Order Specific Question:   Has the patient fasted?    Answer:   No  . Lipid panel    [place]    Order Specific  Question:   Has the patient fasted?    Answer:   No  . HIV antibody  . Hepatitis C antibody    Meds ordered this encounter  Medications  . Calcium Carbonate-Vit D-Min (CALCIUM-VITAMIN D-MINERALS PO)    Sig: Take 1 tablet daily by mouth.  . Omega-3 Fatty Acids (FISH OIL) 1000 MG CAPS    Sig: Take 1 capsule daily by mouth.  . rizatriptan (MAXALT) 10 MG tablet    Sig: Take 1 tablet (10 mg total) as needed by mouth for migraine. May repeat in 2 hours if needed    Dispense:  10 tablet    Refill:  11    Return precautions advised.  Garret Reddish, MD

## 2017-04-16 NOTE — Patient Instructions (Addendum)
No changes today  Please stop by lab before you go  Refilled maxalt

## 2017-04-17 LAB — HEPATITIS C ANTIBODY
Hepatitis C Ab: NONREACTIVE
SIGNAL TO CUT-OFF: 0.01 (ref <1.00)

## 2017-04-17 LAB — HIV ANTIBODY (ROUTINE TESTING W REFLEX): HIV: NONREACTIVE

## 2017-04-19 ENCOUNTER — Encounter: Payer: Self-pay | Admitting: Family Medicine

## 2017-06-13 ENCOUNTER — Encounter: Payer: Self-pay | Admitting: Family Medicine

## 2017-06-13 ENCOUNTER — Ambulatory Visit: Payer: BLUE CROSS/BLUE SHIELD | Admitting: Family Medicine

## 2017-06-13 VITALS — BP 116/72 | HR 83 | Temp 97.5°F | Ht 63.5 in | Wt 113.8 lb

## 2017-06-13 DIAGNOSIS — J329 Chronic sinusitis, unspecified: Secondary | ICD-10-CM

## 2017-06-13 MED ORDER — AMOXICILLIN-POT CLAVULANATE 875-125 MG PO TABS: 1.0000 | ORAL_TABLET | Freq: Two times a day (BID) | ORAL | 0 refills | Status: DC

## 2017-06-13 NOTE — Progress Notes (Signed)
    Subjective:  Joanne Brock is a 67 y.o. female who presents today with a chief complaint of facial pain.   HPI:  Facial Pain, Acute issue Symptoms first started about 10 days ago. Worsened over that time. Associated symptoms include mild cough and rhinorrhea that have since subsided. Pain mostly located in cheeks bilaterally. No fevers or chills. No sick contacts.  No treatments tried.  No obvious alleviating or aggravating factors.  ROS: Per HPI  PMH: she reports that  has never smoked. she has never used smokeless tobacco. She reports that she does not drink alcohol or use drugs.  Objective:  Physical Exam: BP 116/72 (BP Location: Left Arm, Patient Position: Sitting, Cuff Size: Normal)   Pulse 83   Temp (!) 97.5 F (36.4 C) (Oral)   Ht 5' 3.5" (1.613 m)   Wt 113 lb 12.8 oz (51.6 kg)   SpO2 99%   BMI 19.84 kg/m   Gen: NAD, resting comfortably HEENT: TMs with clear effusion bilaterally.  Maxillary sinuses with decreased transillumination bilaterally.  Oropharynx clear.  No lymphadenopathy. CV: RRR with no murmurs appreciated Pulm: NWOB, CTAB with no crackles, wheezes, or rhonchi  Assessment/Plan:  Sinusitis Given 10-day course, we will start antibiotics.  Start 10-day course of Augmentin.  Also advised Tylenol and/or Motrin as needed for low-grade fever and pain.  Encouraged good oral hydration.  Return precautions reviewed.  Follow-up as needed.  Algis Greenhouse. Jerline Pain, MD 06/13/2017 10:57 AM

## 2017-08-06 LAB — HM MAMMOGRAPHY

## 2017-08-07 ENCOUNTER — Encounter: Payer: Self-pay | Admitting: Family Medicine

## 2017-08-07 ENCOUNTER — Other Ambulatory Visit: Payer: Self-pay

## 2017-08-07 MED ORDER — TOPIRAMATE 50 MG PO TABS: 50.0000 mg | ORAL_TABLET | Freq: Every day | ORAL | 1 refills | Status: DC

## 2017-08-13 DIAGNOSIS — Z6820 Body mass index (BMI) 20.0-20.9, adult: Secondary | ICD-10-CM | POA: Diagnosis not present

## 2017-08-13 DIAGNOSIS — Z01419 Encounter for gynecological examination (general) (routine) without abnormal findings: Secondary | ICD-10-CM | POA: Diagnosis not present

## 2017-08-13 DIAGNOSIS — Z130 Encounter for screening for diseases of the blood and blood-forming organs and certain disorders involving the immune mechanism: Secondary | ICD-10-CM | POA: Diagnosis not present

## 2017-08-13 DIAGNOSIS — Z1389 Encounter for screening for other disorder: Secondary | ICD-10-CM | POA: Diagnosis not present

## 2017-08-14 ENCOUNTER — Encounter: Payer: Self-pay | Admitting: Family Medicine

## 2017-08-15 DIAGNOSIS — M779 Enthesopathy, unspecified: Secondary | ICD-10-CM | POA: Diagnosis not present

## 2017-09-12 ENCOUNTER — Encounter: Payer: Self-pay | Admitting: Family Medicine

## 2017-10-01 DIAGNOSIS — H18453 Nodular corneal degeneration, bilateral: Secondary | ICD-10-CM | POA: Diagnosis not present

## 2017-10-01 DIAGNOSIS — H2513 Age-related nuclear cataract, bilateral: Secondary | ICD-10-CM | POA: Diagnosis not present

## 2017-10-01 DIAGNOSIS — H00024 Hordeolum internum left upper eyelid: Secondary | ICD-10-CM | POA: Diagnosis not present

## 2017-10-01 DIAGNOSIS — D239 Other benign neoplasm of skin, unspecified: Secondary | ICD-10-CM | POA: Diagnosis not present

## 2017-10-02 ENCOUNTER — Ambulatory Visit (INDEPENDENT_AMBULATORY_CARE_PROVIDER_SITE_OTHER): Payer: Medicare Other | Admitting: Orthopedic Surgery

## 2017-10-02 ENCOUNTER — Encounter (INDEPENDENT_AMBULATORY_CARE_PROVIDER_SITE_OTHER): Payer: Self-pay | Admitting: Orthopedic Surgery

## 2017-10-02 VITALS — Ht 63.0 in | Wt 113.0 lb

## 2017-10-02 DIAGNOSIS — G5762 Lesion of plantar nerve, left lower limb: Secondary | ICD-10-CM | POA: Diagnosis not present

## 2017-10-02 NOTE — Progress Notes (Signed)
Office Visit Note   Patient: Joanne Brock           Date of Birth: 10-05-1950           MRN: [id number] Visit Date: 10/02/2017              Requested by: Marin Olp, MD [place], [place] [address] PCP: Marin Olp, MD  Chief Complaint  Patient presents with  . Left Foot - Pain      HPI: Patient is a 66 year old woman who states that she has shooting burning pain over the dorsal lateral aspect of her left foot after wearing high heeled shoes she complains of burning pain in the third fourth and fifth toes pain beneath the ball of her foot states she sometimes has cramping in the forefoot.  Assessment & Plan: Visit Diagnoses:  1. Morton neuroma, left     Plan: Patient was given instructions for a stiff soled athletic shoe over-the-counter orthotic arch supports and avoid high heeled shoes or soft flexible shoes.  Discussed that if this flares up again we could consider a steroid injection.  Discussed that if it does not resolve her shoe wear a neuroma excision is a possibility.  Follow-Up Instructions: Return if symptoms worsen or fail to improve.   Ortho Exam  Patient is alert, oriented, no adenopathy, well-dressed, normal affect, normal respiratory effort. 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  She has good subtalar motion.  Patient is point tender to palpation of the third webspace.  Lateral compression reproduces a clunk of the Morton's neuroma.  There is no skin color or temperature changes.  Metatarsal heads are nontender to palpation.  Imaging: No results found. No images are attached to the encounter.  Labs: No results found for: HGBA1C, ESRSEDRATE, CRP, LABURIC, REPTSTATUS, GRAMSTAIN, CULT, LABORGA  @LABSALLVALUES (HGBA1)@  Body mass index is 20.02 kg/m.  Orders:  No orders of the defined types  were placed in this encounter.  No orders of the defined types were placed in this encounter.    Procedures: No procedures performed  Clinical Data: No additional findings.  ROS:  All other systems negative, except as noted in the HPI. Review of Systems  Objective: Vital Signs: Ht 5\' 3"  (1.6 m)   Wt 113 lb (51.3 kg)   BMI 20.02 kg/m   Specialty Comments:  No specialty comments available.  PMFS History: Patient Active Problem List   Diagnosis Date Noted  . Morton neuroma, left 10/02/2017  . Osteopenia 04/16/2017  . Vitamin D deficiency 02/20/2017  . History of adenomatous polyp of colon 02/20/2017  . Hyperlipidemia 02/08/2017  . History of skin cancer   . Migraines    Past Medical History:  Diagnosis Date  . History of skin cancer    squamous and basal x 8 in 2018. Dr. Renda Rolls. Had been with Dr. Tonia Brooms.   . Kidney stone    x1  . Migraines    Aura- smell, spots. once a month- sumatriptan. topamax 50mg  once a day prophylaxis.     Family History  Problem Relation Age of Onset  . Hypertension Mother   . Stroke Mother        TIAs- declined over years and stroke eventually. died 48  . Other Father        decline after ? heat stroke. died  around 80  . Stroke Sister        75. nonsmoker.   Marland Kitchen Ulcerative colitis Daughter     Past Surgical History:  Procedure Laterality Date  . TONSILLECTOMY Bilateral 1960   Social History   Occupational History  . Not on file  Tobacco Use  . Smoking status: Never Smoker  . Smokeless tobacco: Never Used  Substance and Sexual Activity  . Alcohol use: No    Alcohol/week: 0.0 oz  . Drug use: No  . Sexual activity: Yes

## 2017-11-06 ENCOUNTER — Encounter: Payer: Self-pay | Admitting: Family Medicine

## 2017-11-12 ENCOUNTER — Ambulatory Visit (INDEPENDENT_AMBULATORY_CARE_PROVIDER_SITE_OTHER): Payer: Medicare Other | Admitting: Family Medicine

## 2017-11-12 ENCOUNTER — Encounter: Payer: Self-pay | Admitting: Family Medicine

## 2017-11-12 VITALS — BP 118/70 | HR 67 | Temp 98.6°F | Ht 63.0 in | Wt 119.4 lb

## 2017-11-12 DIAGNOSIS — Z7189 Other specified counseling: Secondary | ICD-10-CM

## 2017-11-12 DIAGNOSIS — Z23 Encounter for immunization: Secondary | ICD-10-CM | POA: Diagnosis not present

## 2017-11-12 NOTE — Patient Instructions (Signed)
MMR vaccine today as booster with upcoming travel.  We discussed this is not a requirement due to being born before 1955 (the extra vaccine today should be adequate to cover you even if you had some waning immunity).    We discussed that our charge for this is $130 if insurance does not cover this.  You called your insurance today and they stated that they do cover this.    Prevnar 13 also given today  Hope you have a safe trip

## 2017-11-12 NOTE — Progress Notes (Signed)
Subjective:  Joanne Brock is a 67 y.o. year old very pleasant female patient who presents for/with See problem oriented charting ROS-no fever or chills reported.  Some arm pain as just received Prevnar vaccine.  Past Medical History-  Patient Active Problem List   Diagnosis Date Noted  . History of adenomatous polyp of colon 02/20/2017    Priority: Medium  . Hyperlipidemia 02/08/2017    Priority: Medium  . Migraines     Priority: Medium  . Vitamin D deficiency 02/20/2017    Priority: Low  . History of skin cancer     Priority: Low  . Morton neuroma, left 10/02/2017  . Osteopenia 04/16/2017    Medications- reviewed and updated Current Outpatient Medications  Medication Sig Dispense Refill  . Calcium Carbonate-Vit D-Min (CALCIUM-VITAMIN D-MINERALS PO) Take 1 tablet daily by mouth.    . cholecalciferol (VITAMIN D) 1000 UNITS tablet Take 1,000 Units by mouth daily.    . Coenzyme Q10 (CO Q 10) 10 MG CAPS Take 10 mg by mouth.    . Omega-3 Fatty Acids (FISH OIL) 1000 MG CAPS Take 1 capsule daily by mouth.    Vladimir Faster Glyc-Propyl Glyc PF (SYSTANE ULTRA PF) 0.4-0.3 % SOLN Apply 1 drop topically daily.    . rizatriptan (MAXALT) 10 MG tablet Take 1 tablet (10 mg total) as needed by mouth for migraine. May repeat in 2 hours if needed 10 tablet 11  . topiramate (TOPAMAX) 50 MG tablet Take 1 tablet (50 mg total) by mouth daily. 90 tablet 1   No current facility-administered medications for this visit.     Objective: BP 118/70 (BP Location: Left Arm, Patient Position: Sitting, Cuff Size: Normal)   Pulse 67   Temp 98.6 F (37 C) (Oral)   Ht 5' 3"  (1.6 m)   Wt 119 lb 6.4 oz (54.2 kg)   SpO2 98%   BMI 21.15 kg/m  Gen: NAD, resting comfortably  Assessment/Plan:  Need for prophylactic vaccination against Streptococcus pneumoniae (pneumococcus) - Plan: Pneumococcal conjugate vaccine 13-valent IM  Need for prophylactic vaccination with measles-mumps-rubella (MMR) vaccine - Plan:  MMR vaccine subcutaneous S: Patient comes in for immunization counseling.  She does not recall having measles.  She has no records available from childhood.  Her parents are not available to talk to about this.  We had a discussion that she is likely immune as born before [birth year]  She and her husband are concerned about waning immunity.  They have a lot of travel coming up with two weddings over the next few months.  They are concerned about potentially being exposed while traveling. A/P: We had a discussion today about options- could presume immunity, could check her immunity status, could simply give one MMR vaccination.  Since her age group likely confers immunity- we ultimately agreed to give 1 MMR vaccination which should provide a boost if she does have waning immunity.  The AVS for discussion about potential associated costs.  They called their insurance to confirm that the vaccination was covered in the office.  I told them typically Medicare primary does not cover immunizations like this in the office. With potential cost around $130- they preferred to go ahead and get immunization today  Patient also received her Prevnar 13 today.   Future Appointments  Date Time Provider [place]  04/17/2018  8:15 AM Marin Olp, MD [facility]   Time Stamp The duration of face-to-face time during this visit was greater than 10  minutes. Greater  than 50% of this time was spent in counseling, explanation of diagnosis, planning of further management, and/or coordination of care including potential costs of immunizations, potential benefits, CDC guidelines on immunizations, patient preferences   Return precautions advised.  Garret Reddish, MD

## 2017-11-13 DIAGNOSIS — I8393 Asymptomatic varicose veins of bilateral lower extremities: Secondary | ICD-10-CM | POA: Diagnosis not present

## 2017-11-13 DIAGNOSIS — L719 Rosacea, unspecified: Secondary | ICD-10-CM | POA: Diagnosis not present

## 2018-01-29 DIAGNOSIS — L609 Nail disorder, unspecified: Secondary | ICD-10-CM | POA: Diagnosis not present

## 2018-01-29 DIAGNOSIS — L719 Rosacea, unspecified: Secondary | ICD-10-CM | POA: Diagnosis not present

## 2018-01-29 DIAGNOSIS — L821 Other seborrheic keratosis: Secondary | ICD-10-CM | POA: Diagnosis not present

## 2018-02-22 DIAGNOSIS — D239 Other benign neoplasm of skin, unspecified: Secondary | ICD-10-CM | POA: Diagnosis not present

## 2018-02-22 DIAGNOSIS — H2513 Age-related nuclear cataract, bilateral: Secondary | ICD-10-CM | POA: Diagnosis not present

## 2018-02-22 DIAGNOSIS — H18453 Nodular corneal degeneration, bilateral: Secondary | ICD-10-CM | POA: Diagnosis not present

## 2018-04-17 ENCOUNTER — Ambulatory Visit (INDEPENDENT_AMBULATORY_CARE_PROVIDER_SITE_OTHER): Payer: Medicare Other | Admitting: Family Medicine

## 2018-04-17 ENCOUNTER — Encounter: Payer: Self-pay | Admitting: Family Medicine

## 2018-04-17 VITALS — BP 122/76 | HR 91 | Temp 97.9°F | Ht 63.0 in | Wt 117.6 lb

## 2018-04-17 DIAGNOSIS — M85851 Other specified disorders of bone density and structure, right thigh: Secondary | ICD-10-CM

## 2018-04-17 DIAGNOSIS — Z1211 Encounter for screening for malignant neoplasm of colon: Secondary | ICD-10-CM | POA: Diagnosis not present

## 2018-04-17 DIAGNOSIS — E785 Hyperlipidemia, unspecified: Secondary | ICD-10-CM | POA: Diagnosis not present

## 2018-04-17 DIAGNOSIS — G43909 Migraine, unspecified, not intractable, without status migrainosus: Secondary | ICD-10-CM

## 2018-04-17 DIAGNOSIS — Z0000 Encounter for general adult medical examination without abnormal findings: Secondary | ICD-10-CM

## 2018-04-17 LAB — COMPREHENSIVE METABOLIC PANEL
ALT: 10 U/L (ref 0–35)
AST: 20 U/L (ref 0–37)
Albumin: 4.5 g/dL (ref 3.5–5.2)
Alkaline Phosphatase: 64 U/L (ref 39–117)
BILIRUBIN TOTAL: 0.5 mg/dL (ref 0.2–1.2)
BUN: 13 mg/dL (ref 6–23)
CALCIUM: 9.5 mg/dL (ref 8.4–10.5)
CO2: 30 mEq/L (ref 19–32)
Chloride: 105 mEq/L (ref 96–112)
Creatinine, Ser: 0.71 mg/dL (ref 0.40–1.20)
GFR: 87.63 mL/min (ref >60.00)
Glucose, Bld: 87 mg/dL (ref 70–99)
Potassium: 4 mEq/L (ref 3.5–5.1)
Sodium: 141 mEq/L (ref 135–145)
TOTAL PROTEIN: 6.9 g/dL (ref 6.0–8.3)

## 2018-04-17 LAB — CBC
HCT: 37.4 % (ref 36.0–46.0)
HEMOGLOBIN: 12.6 g/dL (ref 12.0–15.0)
MCHC: 33.7 g/dL (ref 30.0–36.0)
MCV: 86.5 fl (ref 78.0–100.0)
PLATELETS: 194 10*3/uL (ref 150.0–400.0)
RBC: 4.33 Mil/uL (ref 3.87–5.11)
RDW: 12.9 % (ref 11.5–15.5)
WBC: 4.5 10*3/uL (ref 4.0–10.5)

## 2018-04-17 LAB — LIPID PANEL
CHOL/HDL RATIO: 4
Cholesterol: 241 mg/dL — ABNORMAL HIGH (ref 0–200)
HDL: 68.4 mg/dL (ref >39.00)
LDL Cholesterol: 159 mg/dL — ABNORMAL HIGH (ref 0–99)
NonHDL: 172.84
TRIGLYCERIDES: 68 mg/dL (ref 0.0–149.0)
VLDL: 13.6 mg/dL (ref 0.0–40.0)

## 2018-04-17 NOTE — Progress Notes (Signed)
Phone: [phone]  Subjective:  Patient presents today for their annual physical. Chief complaint-noted.   See problem oriented charting- ROS- full  review of systems was completed and negative including No chest pain or shortness of breath. No headache or blurry vision.   The following were reviewed and entered/updated in epic: Past Medical History:  Diagnosis Date  . History of skin cancer    squamous and basal x 8 in 2018. Dr. Renda Rolls. Had been with Dr. Tonia Brooms.   . Kidney stone    x1  . Migraines    Aura- smell, spots. once a month- sumatriptan. topamax 35m once a day prophylaxis.    Patient Active Problem List   Diagnosis Date Noted  . History of adenomatous polyp of colon 02/20/2017    Priority: Medium  . Hyperlipidemia 02/08/2017    Priority: Medium  . Migraines     Priority: Medium  . Vitamin D deficiency 02/20/2017    Priority: Low  . History of skin cancer     Priority: Low  . Morton neuroma, left 10/02/2017  . Osteopenia 04/16/2017   Past Surgical History:  Procedure Laterality Date  . TONSILLECTOMY Bilateral 1960    Family History  Problem Relation Age of Onset  . Hypertension Mother   . Stroke Mother        TIAs- declined over years and stroke eventually. died 869 . Other Father        decline after ? heat stroke. died around 855 . Stroke Sister        693 nonsmoker.   .Marland KitchenUlcerative colitis Daughter     Medications- reviewed and updated Current Outpatient Medications  Medication Sig Dispense Refill  . b complex vitamins capsule Take 1 capsule by mouth daily.    . Calcium Carbonate-Vit D-Min (CALCIUM-VITAMIN D-MINERALS PO) Take 1 tablet daily by mouth.    . cholecalciferol (VITAMIN D) 1000 UNITS tablet Take 1,000 Units by mouth daily.    . Coenzyme Q10 (CO Q 10) 10 MG CAPS Take 10 mg by mouth.    . Omega-3 Fatty Acids (FISH OIL) 1000 MG CAPS Take 1 capsule daily by mouth.    .Vladimir FasterGlyc-Propyl Glyc PF (SYSTANE ULTRA PF) 0.4-0.3 % SOLN  Apply 1 drop topically daily.    . rizatriptan (MAXALT) 10 MG tablet Take 1 tablet (10 mg total) as needed by mouth for migraine. May repeat in 2 hours if needed 10 tablet 11   No current facility-administered medications for this visit.     Allergies-reviewed and updated No Known Allergies  Social History   Social History Narrative   Married over 40 years in 2018. 1 daughter 263years old in 2018. No grandkids- 2 grandpets.    Had to spend great deal of time caring for aging parents- now passed.       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 Oropharynx normal Neck: no thyromegaly CV: RRR no murmurs rubs or gallops Lungs: CTAB no crackles, wheeze, rhonchi Abdomen: soft/nontender/nondistended/normal bowel sounds. No rebound or  guarding.  Ext: no edema Skin: warm, dry Neuro: grossly normal, moves all extremities, PERRLA  Assessment/Plan:  67 y.o. female presenting for annual physical.  Health Maintenance counseling: 1. Anticipatory guidance: Patient counseled regarding regular dental exams -q6 months, eye exams - yearly,  avoiding smoking and second hand smoke, limiting alcohol to 1 beverage per day- doesn't drink at all.  2. Risk factor reduction:  Advised patient of need for regular exercise and diet rich and fruits and vegetables to reduce risk of heart attack and stroke. Exercise-continues at least 3 days a week . Diet-balanced diet- somewhat low on meat-takes B complex- does do eggs still. Gained some weight off topamax Wt Readings from Last 3 Encounters:  04/17/18 117 lb 9.6 oz (53.3 kg)  11/12/17 119 lb 6.4 oz (54.2 kg)  10/02/17  113 lb (51.3 kg)  3. Immunizations/screenings/ancillary studies-discussed Shingrix at pharmacy.  Flu shot given already  Immunization History  Administered Date(s) Administered  . Hepatitis A 11/05/2009  . Influenza, High Dose Seasonal PF 03/19/2018  . Influenza,inj,Quad PF,6+ Mos 04/03/2017  . Influenza-Unspecified 03/03/2009, 03/09/2010, 03/14/2011, 03/19/2012, 03/31/2013, 03/27/2014, 04/02/2015, 04/11/2016  . MMR 11/12/2017  . Pneumococcal Conjugate-13 11/12/2017  . Tdap 06/12/2012  . Zoster 03/20/2013  4. Cervical cancer screening- follows with Dr. Marvel Plan yearly- last pap 2018. Passed age based screening requirements.  5. Breast cancer screening-  breast exam with GYN and mammogram 08/06/17 6. Colon cancer screening - 08/2007 with 10-year follow-up-referred today  7. Skin cancer screening-sees Dr. Renda Rolls once a year-advised regular sunscreen use. Denies worrisome, changing, or new skin lesions.  8.  Osteoporosis screening at 67-done in 2018-has osteopenia 9.  Never smoker  Status of chronic or acute concerns   Osteopenia- at -2.3 right femur neck through GYN 08/22/16. On calcium/vitamin D  HLD- on no rx. 10 year risk under 5% last year. Does take fish oil.  Update lipids  Migraines- uses maxalt prn every 1-2 months. topamax for prophylaxis- migraines have become less and less- she went to once a day first and then later was able to wean off of 2nd pill so has gone from 181m daily dose to 0 mg dose. Has not had migraine since going to 0 pills- has been since July. She states had one mild headache that she did use maxalt for. Feels more mentally clear off topamax  Left hand middle finger was working with glass last week- no shattered glass but felt pinch/pain at end of finger. Mild lingering pain since then- wonders about foreign object. Pain about the same- if this worsens would refer to hand surgery for their opinion/evaluation  1 year physical  Lab/Order associations:  Fasting Preventative health care - Plan: CBC, Comprehensive metabolic panel, Lipid panel, Lipid panel, Comprehensive metabolic panel, CBC  Hyperlipidemia, unspecified hyperlipidemia type - Plan: CBC, Comprehensive metabolic panel, Lipid panel, Lipid panel, Comprehensive metabolic panel, CBC  Screening for colon cancer - Plan: Ambulatory referral to Gastroenterology  Return precautions advised.  SGarret Reddish MD

## 2018-04-17 NOTE — Patient Instructions (Addendum)
We will call you within two weeks about your referral to GI. If you do not hear within 3 weeks, give [place] a call.   Please check with your pharmacy to see if they have the shingrix vaccine. If they do- please get this immunization and update [place] by phone call or mychart with dates you receive the vaccine  Please stop by lab before you go

## 2018-04-23 ENCOUNTER — Other Ambulatory Visit: Payer: Self-pay

## 2018-04-23 DIAGNOSIS — L03012 Cellulitis of left finger: Secondary | ICD-10-CM | POA: Diagnosis not present

## 2018-04-23 MED ORDER — RIZATRIPTAN BENZOATE 10 MG PO TABS: 10.0000 mg | ORAL_TABLET | ORAL | 11 refills | Status: DC | PRN

## 2018-04-25 DIAGNOSIS — L03012 Cellulitis of left finger: Secondary | ICD-10-CM | POA: Diagnosis not present

## 2018-04-26 DIAGNOSIS — L03012 Cellulitis of left finger: Secondary | ICD-10-CM | POA: Diagnosis not present

## 2018-04-26 DIAGNOSIS — M79645 Pain in left finger(s): Secondary | ICD-10-CM | POA: Diagnosis not present

## 2018-04-29 ENCOUNTER — Encounter: Payer: Self-pay | Admitting: Gastroenterology

## 2018-04-30 DIAGNOSIS — L03012 Cellulitis of left finger: Secondary | ICD-10-CM | POA: Diagnosis not present

## 2018-04-30 DIAGNOSIS — M79645 Pain in left finger(s): Secondary | ICD-10-CM | POA: Diagnosis not present

## 2018-05-15 ENCOUNTER — Other Ambulatory Visit: Payer: Self-pay

## 2018-05-15 ENCOUNTER — Ambulatory Visit (AMBULATORY_SURGERY_CENTER): Payer: Self-pay | Admitting: *Deleted

## 2018-05-15 VITALS — Ht 63.0 in | Wt 116.8 lb

## 2018-05-15 DIAGNOSIS — Z1211 Encounter for screening for malignant neoplasm of colon: Secondary | ICD-10-CM

## 2018-05-15 MED ORDER — PEG 3350-KCL-NA BICARB-NACL 420 G PO SOLR: 4000.0000 mL | Freq: Once | ORAL | 0 refills | Status: AC

## 2018-05-15 NOTE — Progress Notes (Signed)
No egg or soy allergy known to patient  No issues with past sedation with any surgeries  or procedures, no intubation problems  No diet pills per patient No home 02 use per patient  No blood thinners per patient  Pt denies issues with constipation  No A fib or A flutter  EMMI video sent to pt's e mail  

## 2018-05-22 ENCOUNTER — Ambulatory Visit (AMBULATORY_SURGERY_CENTER): Payer: Medicare Other | Admitting: Gastroenterology

## 2018-05-22 ENCOUNTER — Encounter: Payer: Self-pay | Admitting: Gastroenterology

## 2018-05-22 VITALS — BP 116/70 | HR 71 | Temp 97.3°F | Resp 16 | Ht 63.0 in | Wt 116.0 lb

## 2018-05-22 DIAGNOSIS — Z1211 Encounter for screening for malignant neoplasm of colon: Secondary | ICD-10-CM

## 2018-05-22 DIAGNOSIS — K5730 Diverticulosis of large intestine without perforation or abscess without bleeding: Secondary | ICD-10-CM

## 2018-05-22 MED ORDER — SODIUM CHLORIDE 0.9 % IV SOLN: 500.0000 mL | Freq: Once | INTRAVENOUS | Status: DC

## 2018-05-22 NOTE — Progress Notes (Signed)
Pt's states no medical or surgical changes since previsit or office visit. 

## 2018-05-22 NOTE — Op Note (Signed)
[facility] Patient Name: Joanne Brock Procedure Date: 05/22/2018 2:33 PM MRN: [id number] Endoscopist: Milus Banister , MD Age: 67 Referring MD:  Date of Birth: Dec 26, 1950 Gender: Female Account #: [id number] Procedure:                Colonoscopy Indications:              Screening for colorectal malignant neoplasm Medicines:                Monitored Anesthesia Care Procedure:                Pre-Anesthesia Assessment:                           - Prior to the procedure, a History and Physical                            was performed, and patient medications and                            allergies were reviewed. The patient's tolerance of                            previous anesthesia was also reviewed. The risks                            and benefits of the procedure and the sedation                            options and risks were discussed with the patient.                            All questions were answered, and informed consent                            was obtained. Prior Anticoagulants: The patient has                            taken no previous anticoagulant or antiplatelet                            agents. ASA Grade Assessment: II - A patient with                            mild systemic disease. After reviewing the risks                            and benefits, the patient was deemed in                            satisfactory condition to undergo the procedure.                           After obtaining informed consent, the colonoscope  was passed under direct vision. Throughout the                            procedure, the patient's blood pressure, pulse, and                            oxygen saturations were monitored continuously. The                            Colonoscope was introduced through the anus and                            advanced to the the cecum, identified by                            appendiceal orifice and  ileocecal valve. The                            colonoscopy was performed without difficulty. The                            patient tolerated the procedure well. The quality                            of the bowel preparation was good. The ileocecal                            valve, appendiceal orifice, and rectum were                            photographed. Scope In: 2:37:06 PM Scope Out: 2:49:25 PM Scope Withdrawal Time: 0 hours 7 minutes 32 seconds  Total Procedure Duration: 0 hours 12 minutes 19 seconds  Findings:                 A few small-mouthed diverticula were found in the                            left colon.                           The exam was otherwise without abnormality on                            direct and retroflexion views. Complications:            No immediate complications. Estimated blood loss:                            None. Estimated Blood Loss:     Estimated blood loss: none. Impression:               - Diverticulosis in the left colon.                           - The examination was otherwise normal on direct  and retroflexion views.                           - No polyps or cancers. Recommendation:           - Patient has a contact number available for                            emergencies. The signs and symptoms of potential                            delayed complications were discussed with the                            patient. Return to normal activities tomorrow.                            Written discharge instructions were provided to the                            patient.                           - Resume previous diet.                           - Continue present medications.                           - Repeat colonoscopy in 10 years for screening. Milus Banister, MD 05/22/2018 2:51:01 PM This report has been signed electronically.

## 2018-05-22 NOTE — Patient Instructions (Signed)
**   Handout given on diverticulosis **   YOU HAD AN ENDOSCOPIC PROCEDURE TODAY AT THE [facility]:   Refer to the procedure report that was given to you for any specific questions about what was found during the examination.  If the procedure report does not answer your questions, please call your gastroenterologist to clarify.  If you requested that your care partner not be given the details of your procedure findings, then the procedure report has been included in a sealed envelope for you to review at your convenience later.  YOU SHOULD EXPECT: Some feelings of bloating in the abdomen. Passage of more gas than usual.  Walking can help get rid of the air that was put into your GI tract during the procedure and reduce the bloating. If you had a lower endoscopy (such as a colonoscopy or flexible sigmoidoscopy) you may notice spotting of blood in your stool or on the toilet paper. If you underwent a bowel prep for your procedure, you may not have a normal bowel movement for a few days.  Please Note:  You might notice some irritation and congestion in your nose or some drainage.  This is from the oxygen used during your procedure.  There is no need for concern and it should clear up in a day or so.  SYMPTOMS TO REPORT IMMEDIATELY:   Following lower endoscopy (colonoscopy or flexible sigmoidoscopy):  Excessive amounts of blood in the stool  Significant tenderness or worsening of abdominal pains  Swelling of the abdomen that is new, acute  Fever of 100F or higher  For urgent or emergent issues, a gastroenterologist can be reached at any hour by calling [phone].   DIET:  We do recommend a small meal at first, but then you may proceed to your regular diet.  Drink plenty of fluids but you should avoid alcoholic beverages for 24 hours.  ACTIVITY:  You should plan to take it easy for the rest of today and you should NOT DRIVE or use heavy machinery until tomorrow (because of the  sedation medicines used during the test).    FOLLOW UP: Our staff will call the number listed on your records the next business day following your procedure to check on you and address any questions or concerns that you may have regarding the information given to you following your procedure. If we do not reach you, we will leave a message.  However, if you are feeling well and you are not experiencing any problems, there is no need to return our call.  We will assume that you have returned to your regular daily activities without incident.  If any biopsies were taken you will be contacted by phone or by letter within the next 1-3 weeks.  Please call us at [phone] if you have not heard about the biopsies in 3 weeks.    SIGNATURES/CONFIDENTIALITY: You and/or your care partner have signed paperwork which will be entered into your electronic medical record.  These signatures attest to the fact that that the information above on your After Visit Summary has been reviewed and is understood.  Full responsibility of the confidentiality of this discharge information lies with you and/or your care-partner. 

## 2018-05-22 NOTE — Progress Notes (Signed)
Report given to PACU, vss 

## 2018-05-23 ENCOUNTER — Telehealth: Payer: Self-pay | Admitting: *Deleted

## 2018-05-23 NOTE — Telephone Encounter (Signed)
  Follow up Call-  Call back number 05/22/2018  Post procedure Call Back phone  # [phone]  Permission to leave phone message Yes  Some recent data might be hidden     Patient questions:  Do you have a fever, pain , or abdominal swelling? No. Pain Score  0 *  Have you tolerated food without any problems? Yes.    Have you been able to return to your normal activities? Yes.    Do you have any questions about your discharge instructions: Diet   No. Medications  No. Follow up visit  No.  Do you have questions or concerns about your Care? No.  Actions: * If pain score is 4 or above: No action needed, pain <4.

## 2018-07-11 DIAGNOSIS — L821 Other seborrheic keratosis: Secondary | ICD-10-CM | POA: Diagnosis not present

## 2018-07-11 DIAGNOSIS — Z86018 Personal history of other benign neoplasm: Secondary | ICD-10-CM | POA: Diagnosis not present

## 2018-07-11 DIAGNOSIS — D225 Melanocytic nevi of trunk: Secondary | ICD-10-CM | POA: Diagnosis not present

## 2018-07-11 DIAGNOSIS — Z808 Family history of malignant neoplasm of other organs or systems: Secondary | ICD-10-CM | POA: Diagnosis not present

## 2018-07-11 DIAGNOSIS — L814 Other melanin hyperpigmentation: Secondary | ICD-10-CM | POA: Diagnosis not present

## 2018-07-11 DIAGNOSIS — L570 Actinic keratosis: Secondary | ICD-10-CM | POA: Diagnosis not present

## 2018-07-11 DIAGNOSIS — Z23 Encounter for immunization: Secondary | ICD-10-CM | POA: Diagnosis not present

## 2018-07-11 DIAGNOSIS — Z85828 Personal history of other malignant neoplasm of skin: Secondary | ICD-10-CM | POA: Diagnosis not present

## 2018-09-13 ENCOUNTER — Ambulatory Visit: Payer: Self-pay

## 2018-09-13 ENCOUNTER — Telehealth: Payer: Self-pay | Admitting: Family Medicine

## 2018-09-13 NOTE — Telephone Encounter (Signed)
Copied from [place] [phone]. Topic: Appointment Scheduling - Scheduling Inquiry for Clinic >> Sep 13, 2018  4:48 PM Blase Mess A wrote: Reason for CRM:  Patient is calling because Left shoulder blade shooting pain. She is taking aleve. But would like to schedule a virtual visit. Please advise. [phone] (M)

## 2018-09-13 NOTE — Telephone Encounter (Signed)
   Answer Assessment - Initial Assessment Questions 1. ONSET: "When did the pain begin?"      3 weeks ago 2. LOCATION: "Where does it hurt?" (upper, mid or lower back)   Left shoulder blade that goes to the center of her back 3. SEVERITY: "How bad is the pain?"  (e.g., Scale 1-10; mild, moderate, or severe)   - MILD (1-3): doesn't interfere with normal activities    - MODERATE (4-7): interferes with normal activities or awakens from sleep    - SEVERE (8-10): excruciating pain, unable to do any normal activities      *No Answer* 4. PATTERN: "Is the pain constant?" (e.g., yes, no; constant, intermittent)      *No Answer* 5. RADIATION: "Does the pain shoot into your legs or elsewhere?"     no 6. CAUSE:  "What do you think is causing the back pain?"      *No Answer* 7. BACK OVERUSE:  "Any recent lifting of heavy objects, strenuous work or exercise?"     *No Answer* 8. MEDICATIONS: "What have you taken so far for the pain?" (e.g., nothing, acetaminophen, NSAIDS) aleve 9. NEUROLOGIC SYMPTOMS: "Do you have any weakness, numbness, or problems with bowel/bladder control?"     *No Answer* 10. OTHER SYMPTOMS: "Do you have any other symptoms?" (e.g., fever, abdominal pain, burning with urination, blood in urine)       *No Answer* 11. PREGNANCY: "Is there any chance you are pregnant?" (e.g., yes, no; LMP)   n/a  Protocols used: BACK PAIN-A-AH

## 2018-09-13 NOTE — Telephone Encounter (Signed)
Was triaging pt and [employer] disconnected. After computer running I called and left message for the pt to call back.

## 2018-09-13 NOTE — Telephone Encounter (Signed)
Called pt back and left message to call back

## 2018-09-13 NOTE — Telephone Encounter (Signed)
Pt c/o left shoulder blade pain for 3 weks. Pt stated that it is a shooting pain that travels from her left shoulder blade towards the middle of her back. Pt has been taking Aleve and discussed dose and frequency of Aleve.  Pt denies chest pain, radiating pain, SOB, sweating, jaw pain neck or left arm pain. Denies nausea.  In the middle of triage, [employer] disconnected and attempted to call pt twice but left messages each time. Here is a note in the cart that pt needs a virtual visit.  "Reason for CRM:  Patient is calling because Left shoulder blade shooting pain. She is taking aleve. But would like to schedule a virtual visit. Please advise. [phone] (M)" dated today at 4:51 pm.

## 2018-09-13 NOTE — Telephone Encounter (Signed)
Called pt back and left message. Informed pt to go to ED if having chest pain, SOB, pain that radiates to her jaw, down her arm, sweating, or if worsens. Pt informed there was a note left for Dr Yong Channel to call pt back. Pt informed it may be Monday before she gets a call back.

## 2018-09-16 NOTE — Telephone Encounter (Signed)
See note

## 2018-09-16 NOTE — Telephone Encounter (Signed)
Wont let me close this as states "disposition needed" I have never seen that before- will send back to Judson Roch to see if she can help closing note.   Judson Roch- thanks for helping patient feel better as well!

## 2018-09-16 NOTE — Telephone Encounter (Signed)
Sounds good

## 2018-09-16 NOTE — Telephone Encounter (Signed)
Patient was triaged and sent to the ED in different telephone encounter.

## 2018-09-16 NOTE — Telephone Encounter (Signed)
FYI   Spoke to pt and she stated that the triage nurse put her on an Aleve regimen that seems to be working. Pt stated that she wants to continue this for the advise 7 days and will call and schedule a virtual visit if sx are not better.

## 2019-02-03 ENCOUNTER — Ambulatory Visit (INDEPENDENT_AMBULATORY_CARE_PROVIDER_SITE_OTHER): Payer: Medicare Other | Admitting: Physician Assistant

## 2019-02-03 ENCOUNTER — Encounter: Payer: Self-pay | Admitting: Physician Assistant

## 2019-02-03 VITALS — Ht 63.0 in | Wt 114.0 lb

## 2019-02-03 DIAGNOSIS — M545 Low back pain, unspecified: Secondary | ICD-10-CM

## 2019-02-03 MED ORDER — PREDNISONE 20 MG PO TABS: ORAL_TABLET | ORAL | 0 refills | Status: DC

## 2019-02-03 NOTE — Progress Notes (Signed)
Virtual Visit via Video   I connected with Joanne Brock on 02/03/19 at  3:40 PM EDT by a video enabled telemedicine application and verified that I am speaking with the correct person using two identifiers. Location patient: Home Location provider: [facility], Office Persons participating in the virtual visit: K[place]le[place], Repp PA-C.  I discussed the limitations of evaluation and management by telemedicine and the availability of in person appointments. The patient expressed understanding and agreed to proceed.  I acted as a Education administrator for [employer], [employer], LPN  Subjective:   HPI:   Back pain Pt c/o lower back pain, L>R. Started couple weeks ago off and on, pain has increased since yesterday. Pain scale 5/10 now but was worse last night. Denies urinary symptoms, no nausea, prior back injury, saddle anesthesia, bowel incontinence.  A few days ago she was helping her daughter make a bed and possibly strained something.  Took an aleve last night and two tylenol this morning. Had mild improvement.  Did have a little bit of pain radiating down the back of her left leg but this resolved.  Denies any numbness.  ROS: See pertinent positives and negatives per HPI.  Patient Active Problem List   Diagnosis Date Noted  . Morton neuroma, left 10/02/2017  . Osteopenia 04/16/2017  . Vitamin D deficiency 02/20/2017  . History of adenomatous polyp of colon 02/20/2017  . Hyperlipidemia 02/08/2017  . History of skin cancer   . Migraines     Social History   Tobacco Use  . Smoking status: Never Smoker  . Smokeless tobacco: Never Used  Substance Use Topics  . Alcohol use: No    Alcohol/week: 0.0 standard drinks    Current Outpatient Medications:  .  b complex vitamins capsule, Take 1 capsule by mouth daily., Disp: , Rfl:  .  Calcium Carbonate-Vit D-Min (CALCIUM-VITAMIN D-MINERALS PO), Take 1 tablet daily by mouth., Disp: , Rfl:  .  cholecalciferol  (VITAMIN D) 1000 UNITS tablet, Take 1,000 Units by mouth daily., Disp: , Rfl:  .  Coenzyme Q10 (CO Q 10) 10 MG CAPS, Take 10 mg by mouth., Disp: , Rfl:  .  Omega-3 Fatty Acids (FISH OIL) 1000 MG CAPS, Take 1 capsule daily by mouth., Disp: , Rfl:  .  Polyethyl Glyc-Propyl Glyc PF (SYSTANE ULTRA PF) 0.4-0.3 % SOLN, Apply 1 drop topically daily., Disp: , Rfl:  .  rizatriptan (MAXALT) 10 MG tablet, Take 1 tablet (10 mg total) by mouth as needed for migraine. May repeat in 2 hours if needed, Disp: 10 tablet, Rfl: 11 .  predniSONE (DELTASONE) 20 MG tablet, Take one tablet daily x 3 days, then 1/2 tablet x 4 days, Disp: 5 tablet, Rfl: 0  No Known Allergies  Objective:   VITALS: Per patient if applicable, see vitals. GENERAL: Alert, appears well and in no acute distress. HEENT: Atraumatic, conjunctiva clear, no obvious abnormalities on inspection of external nose and ears. NECK: Normal movements of the head and neck. CARDIOPULMONARY: No increased WOB. Speaking in clear sentences. I:E ratio WNL.  MS: Moves all visible extremities without noticeable abnormality. PSYCH: Pleasant and cooperative, well-groomed. Speech normal rate and rhythm. Affect is appropriate. Insight and judgement are appropriate. Attention is focused, linear, and appropriate.  NEURO: CN grossly intact. Oriented as arrived to appointment on time with no prompting. Moves both UE equally.  SKIN: No obvious lesions, wounds, erythema, or cyanosis noted on face or hands.  Assessment and Plan:  Joanne Brock was seen today for back pain.  Diagnoses and all orders for this visit:  Acute bilateral low back pain, unspecified whether sciatica present  Other orders -     predniSONE (DELTASONE) 20 MG tablet; Take one tablet daily x 3 days, then 1/2 tablet x 4 days   Based on what she is describing I think she has some potential SI joint dysfunction.  She could have had a component of some sciatica but this has resolved per her report.  She has  tolerated prednisone in the past for neck pain, so we will trial this.  I advised her to stop the ibuprofen while she is on this medication.  Worsening precautions advised.  If no improvement, will send to physical therapy for further evaluation and treatment.  . Reviewed expectations re: course of current medical issues. . Discussed self-management of symptoms. . Outlined signs and symptoms indicating need for more acute intervention. . Patient verbalized understanding and all questions were answered. Marland Kitchen Health Maintenance issues including appropriate healthy diet, exercise, and smoking avoidance were discussed with patient. . See orders for this visit as documented in the electronic medical record.  I discussed the assessment and treatment plan with the patient. The patient was provided an opportunity to ask questions and all were answered. The patient agreed with the plan and demonstrated an understanding of the instructions.   The patient was advised to call back or seek an in-person evaluation if the symptoms worsen or if the condition fails to improve as anticipated.   CMA or LPN served as scribe during this visit. History, Physical, and Plan performed by medical provider. The above documentation has been reviewed and is accurate and complete.   [place], [place] 02/03/2019

## 2019-03-07 DIAGNOSIS — H18453 Nodular corneal degeneration, bilateral: Secondary | ICD-10-CM | POA: Diagnosis not present

## 2019-03-07 DIAGNOSIS — D23121 Other benign neoplasm of skin of left upper eyelid, including canthus: Secondary | ICD-10-CM | POA: Diagnosis not present

## 2019-03-07 DIAGNOSIS — H2513 Age-related nuclear cataract, bilateral: Secondary | ICD-10-CM | POA: Diagnosis not present

## 2019-03-14 DIAGNOSIS — Z1231 Encounter for screening mammogram for malignant neoplasm of breast: Secondary | ICD-10-CM | POA: Diagnosis not present

## 2019-03-14 LAB — HM MAMMOGRAPHY

## 2019-03-25 ENCOUNTER — Encounter: Payer: Self-pay | Admitting: Family Medicine

## 2019-03-25 DIAGNOSIS — Z23 Encounter for immunization: Secondary | ICD-10-CM | POA: Diagnosis not present

## 2019-04-23 ENCOUNTER — Telehealth: Payer: Self-pay | Admitting: Family Medicine

## 2019-04-23 ENCOUNTER — Other Ambulatory Visit: Payer: Self-pay

## 2019-04-23 ENCOUNTER — Encounter: Payer: Self-pay | Admitting: Family Medicine

## 2019-04-23 ENCOUNTER — Ambulatory Visit (INDEPENDENT_AMBULATORY_CARE_PROVIDER_SITE_OTHER): Payer: Medicare Other | Admitting: Family Medicine

## 2019-04-23 VITALS — BP 124/62 | HR 96 | Temp 98.9°F | Ht 63.0 in | Wt 115.2 lb

## 2019-04-23 DIAGNOSIS — E785 Hyperlipidemia, unspecified: Secondary | ICD-10-CM

## 2019-04-23 DIAGNOSIS — Z86010 Personal history of colonic polyps: Secondary | ICD-10-CM

## 2019-04-23 DIAGNOSIS — Z0000 Encounter for general adult medical examination without abnormal findings: Secondary | ICD-10-CM

## 2019-04-23 DIAGNOSIS — E559 Vitamin D deficiency, unspecified: Secondary | ICD-10-CM | POA: Diagnosis not present

## 2019-04-23 DIAGNOSIS — G43109 Migraine with aura, not intractable, without status migrainosus: Secondary | ICD-10-CM | POA: Diagnosis not present

## 2019-04-23 DIAGNOSIS — Z85828 Personal history of other malignant neoplasm of skin: Secondary | ICD-10-CM | POA: Diagnosis not present

## 2019-04-23 DIAGNOSIS — Z23 Encounter for immunization: Secondary | ICD-10-CM | POA: Diagnosis not present

## 2019-04-23 DIAGNOSIS — M85851 Other specified disorders of bone density and structure, right thigh: Secondary | ICD-10-CM | POA: Diagnosis not present

## 2019-04-23 LAB — COMPREHENSIVE METABOLIC PANEL
ALT: 10 U/L (ref 0–35)
AST: 20 U/L (ref 0–37)
Albumin: 4.6 g/dL (ref 3.5–5.2)
Alkaline Phosphatase: 63 U/L (ref 39–117)
BUN: 9 mg/dL (ref 6–23)
CO2: 28 mEq/L (ref 19–32)
Calcium: 9.4 mg/dL (ref 8.4–10.5)
Chloride: 104 mEq/L (ref 96–112)
Creatinine, Ser: 0.79 mg/dL (ref 0.40–1.20)
GFR: 72.67 mL/min (ref >60.00)
Glucose, Bld: 90 mg/dL (ref 70–99)
Potassium: 4.1 mEq/L (ref 3.5–5.1)
Sodium: 140 mEq/L (ref 135–145)
Total Bilirubin: 0.5 mg/dL (ref 0.2–1.2)
Total Protein: 7 g/dL (ref 6.0–8.3)

## 2019-04-23 LAB — CBC WITH DIFFERENTIAL/PLATELET
Basophils Absolute: 0 10*3/uL (ref 0.0–0.1)
Basophils Relative: 0.3 % (ref 0.0–3.0)
Eosinophils Absolute: 0 10*3/uL (ref 0.0–0.7)
Eosinophils Relative: 0.5 % (ref 0.0–5.0)
HCT: 36.1 % (ref 36.0–46.0)
Hemoglobin: 12.3 g/dL (ref 12.0–15.0)
Lymphocytes Relative: 28.9 % (ref 12.0–46.0)
Lymphs Abs: 1.3 10*3/uL (ref 0.7–4.0)
MCHC: 34 g/dL (ref 30.0–36.0)
MCV: 85.9 fl (ref 78.0–100.0)
Monocytes Absolute: 0.4 10*3/uL (ref 0.1–1.0)
Monocytes Relative: 9.1 % (ref 3.0–12.0)
Neutro Abs: 2.6 10*3/uL (ref 1.4–7.7)
Neutrophils Relative %: 61.2 % (ref 43.0–77.0)
Platelets: 179 10*3/uL (ref 150.0–400.0)
RBC: 4.21 Mil/uL (ref 3.87–5.11)
RDW: 13.4 % (ref 11.5–15.5)
WBC: 4.3 10*3/uL (ref 4.0–10.5)

## 2019-04-23 LAB — LIPID PANEL
Cholesterol: 247 mg/dL — ABNORMAL HIGH (ref 0–200)
HDL: 69 mg/dL (ref >39.00)
LDL Cholesterol: 160 mg/dL — ABNORMAL HIGH (ref 0–99)
NonHDL: 177.7
Total CHOL/HDL Ratio: 4
Triglycerides: 91 mg/dL (ref 0.0–149.0)
VLDL: 18.2 mg/dL (ref 0.0–40.0)

## 2019-04-23 LAB — VITAMIN D 25 HYDROXY (VIT D DEFICIENCY, FRACTURES): VITD: 41.09 ng/mL (ref 30.00–100.00)

## 2019-04-23 MED ORDER — RIZATRIPTAN BENZOATE 10 MG PO TABS: 10.0000 mg | ORAL_TABLET | ORAL | 11 refills | Status: DC | PRN

## 2019-04-23 NOTE — Patient Instructions (Addendum)
Health Maintenance Due  Topic Date Due  . PNA vac Low Risk Adult (2 of 2 - PPSV23) will get in office  11/13/2018    Ms. Neaves , Thank you for taking time to come for your Medicare Wellness Visit. I appreciate your ongoing commitment to your health goals. Please review the following plan we discussed and let me know if I can assist you in the future.   These are the goals we discussed: 1. Please check with your pharmacy to see if they have the shingrix vaccine. If they do- please get this immunization and update [place] by phone call or mychart with dates you receive the vaccine 2. Keep up great job with healthy eating/regular execise 3.Recommended follow up: 1 year follow-up with me.  Can also schedule a wellness visit with our nurse/health coach on the same day 4. Sign release of information at the check out desk for last mammogram  This is a list of the screening recommended for you and due dates:  Health Maintenance  Topic Date Due  . Pneumonia vaccines (2 of 2 - PPSV23) 11/13/2018  . Mammogram  08/07/2019  . Tetanus Vaccine  06/12/2022  . Colon Cancer Screening  05/22/2028  . Flu Shot  Completed  . DEXA scan (bone density measurement)  Completed  .  Hepatitis C: One time screening is recommended by Center for Disease Control  (CDC) for  adults born from [birth year] through 1963.   Completed

## 2019-04-23 NOTE — Telephone Encounter (Signed)
I contacted the patient and advised that coding stated that she should have had an annual wellness visit instead of a physical last year. Coding stated that she has no balance and the remaining balance was adjusted off back in may. No further action required from last year.  I have set the patient up for a yearly office visit with Dr Yong Channel next year and her annual wellness visit.

## 2019-04-23 NOTE — Telephone Encounter (Signed)
Hi Marcene Brawn,  This looks to have been denied by Medicare since they do not cover cpe, but looks like patient's supplement paid a portion and the remaining was adjusted off.  The documentation does not support billing any other way.    Thanks,  [employer]

## 2019-04-23 NOTE — Addendum Note (Signed)
Addended by: Francella Solian on: 04/23/2019 09:18 AM   Modules accepted: Orders

## 2019-04-23 NOTE — Telephone Encounter (Signed)
Patient stated that the coding from 04/17/2018 was incorrect per Medicare because it was coded as an exam. Dawn can you look into this please?

## 2019-04-23 NOTE — Progress Notes (Signed)
Phone: [phone]    Subjective:   Patient presents today for their Initial annual wellness visit.    Preventive Screening-Counseling & Management   Hearing Screening   125Hz  250Hz  500Hz  1000Hz  2000Hz  3000Hz  4000Hz  6000Hz  8000Hz   Right ear:           Left ear:           Comments: Pass both ears    Visual Acuity Screening   Right eye Left eye Both eyes  Without correction: 20/30 20/20 20/20   With correction:      Smoking Status: Never Smoker Second Hand Smoking status: No smokers in home Alcohol intake: 0 per week  Risk Factors Regular exercise:  Slightly off with covid but in general pretty good Diet: reasonably health  Wt Readings from Last 3 Encounters:  04/23/19 115 lb 3.2 oz (52.3 kg)  02/03/19 114 lb (51.7 kg)  05/22/18 116 lb (52.6 kg)   Fall Risk: None  Fall Risk  04/23/2019 04/17/2018 02/08/2017  Falls in the past year? 0 0 No  Number falls in past yr: 0 - -  Injury with Fall? 0 - -   Opioid use history:  no long term opioids use  Cardiac/CV risk factors:  advanced age (older than 49 for men, 32 for women)  known Hyperlipidemia - untreated at present no Hypertension  No diabetes.  Family History:  stroke in mom - died at 6 but TIAs before that. Sister with stroke at 93. She is hoping for father's genes!   Depression Screen None. PHQ2 0  Depression screen [facility] 2/9 04/23/2019 04/17/2018 02/08/2017  Decreased Interest 0 0 0  Down, Depressed, Hopeless 0 0 0  PHQ - 2 Score 0 0 0  Altered sleeping 0 - -  Tired, decreased energy 0 - -  Change in appetite 0 - -  Feeling bad or failure about yourself  0 - -  Trouble concentrating 0 - -  Moving slowly or fidgety/restless 0 - -  Suicidal thoughts 0 - -  PHQ-9 Score 0 - -  Difficult doing work/chores Not difficult at all - -    Activities of Daily Living Independent ADLs and IADLs   Hearing Difficulties: -patient declines. Passed hearing screen.   Cognitive Testing             No reported trouble.   Mini cog: normal clock draw- did need some time on this but primarily uses digital clock. 2/3 delayed recall. Normal test result   [facility]  List the Names of Other Physician/Practitioners you currently use: Patient Care Team: Marin Olp, MD as PCP - General (Family Medicine) [place], Jennefer Bravo, MD as Referring Physician (Dermatology) Paula Compton, MD as Consulting Physician (Obstetrics and Gynecology) -Dr. Susa Simmonds optho  Immunization History  Administered Date(s) Administered  . Hepatitis A 11/05/2009  . Influenza, High Dose Seasonal PF 03/19/2018, 03/24/2019  . Influenza,inj,Quad PF,6+ Mos 04/03/2017  . Influenza-Unspecified 03/03/2009, 03/09/2010, 03/14/2011, 03/19/2012, 03/31/2013, 03/27/2014, 04/02/2015, 04/11/2016  . MMR 11/12/2017  . Pneumococcal Conjugate-13 11/12/2017  . Tdap 06/12/2012  . Zoster 03/20/2013   Required Immunizations needed today - discussed shingrix at pharmacy . Pneumovax 23 today Health Maintenance  Topic Date Due  . Pneumonia vaccines (2 of 2 - PPSV23) 11/13/2018  . Mammogram  08/07/2019  . Tetanus Vaccine  06/12/2022  . Colon Cancer Screening  05/22/2028  . Flu Shot  Completed  . DEXA scan (bone density measurement)  Completed  .  Hepatitis C: One time screening  is recommended by Center for Disease Control  (CDC) for  adults born from [birth year] through 1963.   Completed   Screening tests-  1. Colon cancer screening- 05/22/2018 with 10-year follow-up.  History of adenomatous polyp but not on most recent exam so was spaced to 10 years  2. Lung Cancer screening-not a candidate as never smoker 3. Skin cancer screening- sees Dr. Renda Rolls once per year 4. Cervical cancer screening- -past age to based screening recommendations but continues to follow-up with Dr. Marvel Plan. 5. Breast cancer screening- still gets breast exams with Dr. Marvel Plan of gynecology-last mammogram 08/06/2017 on record. Got pushed back due to covid- need  records  ROS- No pertinent positives discovered in course of AWV ROS pertinent- No chest pain or shortness of breath. No regular headaches- has intermittent migraines. worsening blurry vision (some issues in right eye)  The following were reviewed and entered/updated in epic: Past Medical History:  Diagnosis Date  . Allergy   . Cancer ([place])    basal cell and squamous cell skin cancer  . History of skin cancer    squamous and basal x 8 in 2018. Dr. Renda Rolls. Had been with Dr. Tonia Brooms.   . Kidney stone    x1  . Migraines    Aura- smell, spots. once a month- sumatriptan. topamax 77m once a day prophylaxis.   . Osteopenia    Patient Active Problem List   Diagnosis Date Noted  . History of adenomatous polyp of colon 02/20/2017    Priority: Medium  . Hyperlipidemia 02/08/2017    Priority: Medium  . Migraines     Priority: Medium  . Vitamin D deficiency 02/20/2017    Priority: Low  . History of skin cancer     Priority: Low  . Morton neuroma, left 10/02/2017  . Osteopenia 04/16/2017   Past Surgical History:  Procedure Laterality Date  . COLONOSCOPY    . TONSILLECTOMY Bilateral 1960    Family History  Problem Relation Age of Onset  . Hypertension Mother   . Stroke Mother        TIAs- declined over years and stroke eventually. died 867 . Other Father        decline after ? heat stroke. died around 830 . Stroke Sister        89 nonsmoker.   .Marland KitchenUlcerative colitis Daughter   . Colon cancer Neg Hx   . Colon polyps Neg Hx   . Esophageal cancer Neg Hx   . Stomach cancer Neg Hx   . Rectal cancer Neg Hx     Medications- reviewed and updated Current Outpatient Medications  Medication Sig Dispense Refill  . b complex vitamins capsule Take 1 capsule by mouth daily.    . Calcium Carbonate-Vit D-Min (CALCIUM-VITAMIN D-MINERALS PO) Take 1 tablet daily by mouth.    . cholecalciferol (VITAMIN D) 1000 UNITS tablet Take 1,000 Units by mouth daily.    . Coenzyme Q10 (CO Q 10) 10  MG CAPS Take 10 mg by mouth.    . Omega-3 Fatty Acids (FISH OIL) 1000 MG CAPS Take 1 capsule daily by mouth.    .Vladimir FasterGlyc-Propyl Glyc PF (SYSTANE ULTRA PF) 0.4-0.3 % SOLN Apply 1 drop topically daily.    . rizatriptan (MAXALT) 10 MG tablet Take 1 tablet (10 mg total) by mouth as needed for migraine. May repeat in 2 hours if needed 10 tablet 11   No current facility-administered medications for this visit.     Allergies-reviewed and  updated No Known Allergies  Social History   Socioeconomic History  . Marital status: Married    Spouse name: Not on file  . Number of children: Not on file  . Years of education: Not on file  . Highest education level: Not on file  Occupational History  . Not on file  Social Needs  . Financial resource strain: Not on file  . Food insecurity    Worry: Not on file    Inability: Not on file  . Transportation needs    Medical: Not on file    Non-medical: Not on file  Tobacco Use  . Smoking status: Never Smoker  . Smokeless tobacco: Never Used  Substance and Sexual Activity  . Alcohol use: No    Alcohol/week: 0.0 standard drinks  . Drug use: No  . Sexual activity: Yes  Lifestyle  . Physical activity    Days per week: Not on file    Minutes per session: Not on file  . Stress: Not on file  Relationships  . Social Herbalist on phone: Not on file    Gets together: Not on file    Attends religious service: Not on file    Active member of club or organization: Not on file    Attends meetings of clubs or organizations: Not on file    Relationship status: Not on file  Other Topics Concern  . Not on file  Social History Narrative   Married over 40 years in 2018. 1 daughter 75 years old in 2018. No grandkids- 2 grandpets.    Had to spend great deal of time caring for aging parents- now passed.       Undergrad at [employer].    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 [employer]: travel      Objective:  BP  124/62   Pulse 96   Temp 98.9 F (37.2 C) (Temporal)   Ht 5' 3"  (1.6 m)   Wt 115 lb 3.2 oz (52.3 kg)   SpO2 96%   BMI 20.41 kg/m  Gen: NAD, resting comfortably HEENT: Mucous membranes are moist. Oropharynx normal Neck: no thyromegaly CV: RRR no murmurs rubs or gallops Lungs: CTAB no crackles, wheeze, rhonchi Abdomen: soft/nontender/nondistended/normal bowel sounds. No rebound or guarding.  Ext: no edema Skin: warm, dry Neuro: grossly normal, moves all extremities, PERRLA    Assessment/Plan:  AWV completed- discussed recommended screenings and documented any personalized health advice and referrals for preventive counseling. See AVS as well which was given to patient.   Status of chronic or acute concerns   #hyperlipidemia S: Not currently on treatment. Stroke in mom and sister Lab Results  Component Value Date   CHOL 241 (H) 04/17/2018   HDL 68.40 04/17/2018   LDLCALC 159 (H) 04/17/2018   TRIG 68.0 04/17/2018   CHOLHDL 4 04/17/2018   A/P:  Last years lipids with this year BP and age calculates out to 5.9% ascvd risk estimation below 7.5% threshold. Offered once week statin given family history but she would prefer to wait unless gets over 7.5% threshold. Check cholesterol today and recalculate risk.   #Migraines S:rizatriptan very helpful. Migraines 4-5 in last year.   A/P: Stable. Continue current medications.  Refill today   #Vitamin D deficiency S: takes 1000 vitamin D3.  A/P: Hopefully stable-update vitamin D with labs  #Osteopenia-right femur S:Last completed with Dr. Marvel Plan March 2018 with worst T score of -2.3.  Patient is compliant with calcium 534m once a day and vitamin D 1000 units per day. She tries to do weight bearing exercise A/P: hopefully stable- asked her to get [place] copy of next bone density when occurs- if she does not have this by next year- we may order.  - continue calcium, vitamin d, weight bearing exercise    Recommended follow up: 1 year  follow-up with me.  Can also schedule a wellness visit with our nurse/health coach on the same day   Lab/Order associations: fasting   ICD-10-CM   1. Preventative health care  Z00.00 CBC with Differential/Platelet    Comprehensive metabolic panel    Lipid panel    Vitamin D  (25 hydroxy )  2. Hyperlipidemia, unspecified hyperlipidemia type  E78.5 CBC with Differential/Platelet    Comprehensive metabolic panel    Lipid panel  3. Migraine with aura and without status migrainosus, not intractable  G43.109   4. History of adenomatous polyp of colon  Z86.010   5. Vitamin D deficiency  E55.9 Vitamin D  (25 hydroxy )  6. History of skin cancer  Z85.828   7. Osteopenia of neck of right femur  M85.851     Meds ordered this encounter  Medications  . rizatriptan (MAXALT) 10 MG tablet    Sig: Take 1 tablet (10 mg total) by mouth as needed for migraine. May repeat in 2 hours if needed    Dispense:  10 tablet    Refill:  11    Return precautions advised.  SGarret Reddish MD

## 2019-04-24 ENCOUNTER — Encounter: Payer: Self-pay | Admitting: Family Medicine

## 2019-06-22 ENCOUNTER — Encounter: Payer: Self-pay | Admitting: Family Medicine

## 2019-06-24 ENCOUNTER — Encounter: Payer: Self-pay | Admitting: Family Medicine

## 2019-06-27 ENCOUNTER — Ambulatory Visit (INDEPENDENT_AMBULATORY_CARE_PROVIDER_SITE_OTHER): Payer: Medicare Other | Admitting: Family Medicine

## 2019-06-27 ENCOUNTER — Encounter: Payer: Self-pay | Admitting: Family Medicine

## 2019-06-27 VITALS — Ht 63.0 in | Wt 110.0 lb

## 2019-06-27 DIAGNOSIS — R413 Other amnesia: Secondary | ICD-10-CM

## 2019-06-27 DIAGNOSIS — G4700 Insomnia, unspecified: Secondary | ICD-10-CM | POA: Diagnosis not present

## 2019-06-27 DIAGNOSIS — F418 Other specified anxiety disorders: Secondary | ICD-10-CM

## 2019-06-27 NOTE — Progress Notes (Signed)
Phone [phone] Virtual visit via Video note   Subjective:  Chief complaint: Chief Complaint  Patient presents with  . Insomnia    2-3 weeks.     This visit type was conducted due to national recommendations for restrictions regarding the COVID-19 Pandemic (e.g. social distancing).  This format is felt to be most appropriate for this patient at this time balancing risks to patient and risks to population by having him in for in person visit.  No physical exam was performed (except for noted visual exam or audio findings with Telehealth visits).    Our team/I connected with Joanne Brock at 10:40 AM EST by a video enabled telemedicine application (doxy.me or caregility through epic) and verified that I am speaking with the correct person using two identifiers.  Location patient: [place] Location provider: [facility], office Persons participating in the virtual visit:  patient  Our team/I discussed the limitations of evaluation and management by telemedicine and the availability of in person appointments. In light of current covid-19 pandemic, patient also understands that we are trying to protect them by minimizing in office contact if at all possible.  The patient expressed consent for telemedicine visit and agreed to proceed. Patient understands insurance will be billed.   Past Medical History-  Patient Active Problem List   Diagnosis Date Noted  . History of adenomatous polyp of colon 02/20/2017    Priority: Medium  . Hyperlipidemia 02/08/2017    Priority: Medium  . Migraines     Priority: Medium  . Vitamin D deficiency 02/20/2017    Priority: Low  . History of skin cancer     Priority: Low  . Morton neuroma, left 10/02/2017  . Osteopenia 04/16/2017    Medications- reviewed and updated Current Outpatient Medications  Medication Sig Dispense Refill  . b complex vitamins capsule Take 1 capsule by mouth daily.    . Calcium Carbonate-Vit D-Min (CALCIUM-VITAMIN  D-MINERALS PO) Take 1 tablet daily by mouth.    . cholecalciferol (VITAMIN D) 1000 UNITS tablet Take 1,000 Units by mouth daily.    . Coenzyme Q10 (CO Q 10) 10 MG CAPS Take 10 mg by mouth.    . Omega-3 Fatty Acids (FISH OIL) 1000 MG CAPS Take 1 capsule daily by mouth.    Joanne Brock Glyc-Propyl Glyc PF (SYSTANE ULTRA PF) 0.4-0.3 % SOLN Apply 1 drop topically daily.    . rizatriptan (MAXALT) 10 MG tablet Take 1 tablet (10 mg total) by mouth as needed for migraine. May repeat in 2 hours if needed 10 tablet 11   No current facility-administered medications for this visit.     Objective:  Ht 5\' 3"  (1.6 m)   Wt 110 lb (49.9 kg)   BMI 19.49 kg/m  self reported vitals Gen: NAD, resting comfortably Lungs: nonlabored, normal respiratory rate  Skin: appears dry, no obvious rash     Assessment and Plan   # Sleep issues/situational anxiety S:No issues with sleeping until got a jury summons. Up to 3 days a month for the next year.   Generally she sleeps very well but since she got the summons has had significant issues over last 2-3 weeks. Patient started having symptoms about 2-3 weeks ago. She states that she has no problems with falling asleep but wakes up about 2 am unable to go back to sleep. She feels that she is only getting 4-5 of uninterrupted sleep at night. She has not tried anything over the counter to help with sleep. She  has has had some changes in home life that started around time of symptoms.  A/P: Situational anxiety and insomnia seem to primarily be related to jury summons.  I completely agreed with patient that I did not think she should be in a court room until fully vaccinated from COVID-19.  I previously written the letter of support that they had not been able to access on my chart yet-I wrote a new letter today.  If any issues retrieving this-we can print and mail or print and they can pick up.   # Memory concern S:words do not come to her as rapidly as they previously had.  She needed some extended time on clock draw at last visit. Had been on Topamax years ago for migraines- had previously weaned off- wonders if prior Topamax affected her cognition-I do not feel strongly that it did.  Memory issues worse since not sleeping as well.  A/P: Patient asks if there is much that can be done about memory concerns.  I told her since she continues to have concerns I would recommend a Mini-Mental status exam at follow-up visit.  If symptoms worsen I recommended that we see her sooner.  We can do a reversible cause of dementia work-up including depression screening, B12, TSH at minimum.  We also discussed potentially could do MRI of the brain though I do not strongly suspect stroke.  We discussed regular physical activity and trying to learn new activities/stretch the brain.  We could also do a neurology referral if needed -I do suspect some of the memory issues may improve when she is able to sleep better months Jury issue was addressed  Recommended follow up: As needed for acute concerns-otherwise he can see Lovena Le in December Future Appointments  Date Time Provider [place]  06/27/2019 10:40 AM Marin Olp, MD [facility]  04/23/2020 10:30 AM [facility] HEALTH COACH [facility]  04/23/2020 11:00 AM Marin Olp, MD [facility]    Lab/Order associations:   ICD-10-CM   1. Insomnia, unspecified type  G47.00   2. Situational anxiety  F41.8   3. Memory loss  R41.3    Time Spent: 27 minutes of total time (10:01 AM- 10:28 AM) was spent on the date of the encounter performing the following actions: chart review prior to seeing the patient, obtaining history, performing a medically necessary exam, counseling on the treatment plan, placing orders, and documenting in our EHR.   Return precautions advised.  Garret Reddish, MD

## 2019-06-27 NOTE — Patient Instructions (Signed)
There are no preventive care reminders to display for this patient.  Depression screen [facility] 2/9 06/27/2019 04/23/2019 04/17/2018  Decreased Interest 3 0 0  Down, Depressed, Hopeless 0 0 0  PHQ - 2 Score 3 0 0  Altered sleeping 3 0 -  Tired, decreased energy 3 0 -  Change in appetite 3 0 -  Feeling bad or failure about yourself  0 0 -  Trouble concentrating 0 0 -  Moving slowly or fidgety/restless 0 0 -  Suicidal thoughts 0 0 -  PHQ-9 Score 12 0 -  Difficult doing work/chores Not difficult at all Not difficult at all -    Recommended follow up: No follow-ups on file.

## 2019-06-29 ENCOUNTER — Encounter: Payer: Self-pay | Admitting: Family Medicine

## 2019-07-01 ENCOUNTER — Ambulatory Visit: Payer: Medicare Other | Attending: Internal Medicine

## 2019-07-01 DIAGNOSIS — Z23 Encounter for immunization: Secondary | ICD-10-CM | POA: Diagnosis not present

## 2019-07-01 NOTE — Progress Notes (Signed)
   Covid-19 Vaccination Clinic  Name:  ASAIAH MILHOUS    MRN: [id number] DOB: [birth year]  07/01/2019  Ms. Fahl was observed post Covid-19 immunization for 15 minutes without incidence. She was provided with Vaccine Information Sheet and instruction to access the V-Safe system.   Ms. Ekblad was instructed to call 911 with any severe reactions post vaccine: Marland Kitchen Difficulty breathing  . Swelling of your face and throat  . A fast heartbeat  . A bad rash all over your body  . Dizziness and weakness    Immunizations Administered    Name Date Dose VIS Date Route   Pfizer COVID-19 Vaccine 07/01/2019  5:03 PM 0.3 mL 05/23/2019 Intramuscular   Manufacturer: [place]   Lot: [id number]   [place]: [id number]

## 2019-07-03 ENCOUNTER — Ambulatory Visit (INDEPENDENT_AMBULATORY_CARE_PROVIDER_SITE_OTHER): Payer: Medicare Other | Admitting: Family Medicine

## 2019-07-03 ENCOUNTER — Encounter: Payer: Self-pay | Admitting: Family Medicine

## 2019-07-03 ENCOUNTER — Other Ambulatory Visit: Payer: Self-pay

## 2019-07-03 VITALS — BP 138/78 | HR 69 | Temp 98.4°F | Ht 63.0 in | Wt 113.4 lb

## 2019-07-03 DIAGNOSIS — L989 Disorder of the skin and subcutaneous tissue, unspecified: Secondary | ICD-10-CM

## 2019-07-03 NOTE — Progress Notes (Signed)
  Phone [phone] In person visit   Subjective:   Joanne Brock is a 69 y.o. year old very pleasant female patient who presents for/with See problem oriented charting Chief Complaint  Patient presents with  . Cyst    on lower back that has gotten bigger    This visit occurred during the SARS-CoV-2 public health emergency.  Safety protocols were in place, including screening questions prior to the visit, additional usage of staff PPE, and extensive cleaning of exam room while observing appropriate contact time as indicated for disinfecting solutions.   Past Medical History-  Patient Active Problem List   Diagnosis Date Noted  . History of adenomatous polyp of colon 02/20/2017    Priority: Medium  . Hyperlipidemia 02/08/2017    Priority: Medium  . Migraines     Priority: Medium  . Vitamin D deficiency 02/20/2017    Priority: Low  . History of skin cancer     Priority: Low  . Morton neuroma, left 10/02/2017  . Osteopenia 04/16/2017    Medications- reviewed and updated Current Outpatient Medications  Medication Sig Dispense Refill  . Multiple Vitamin (MULTIVITAMIN) capsule Take 1 capsule by mouth daily.    . rizatriptan (MAXALT) 10 MG tablet Take 10 mg by mouth as needed for migraine. May repeat in 2 hours if needed     No current facility-administered medications for this visit.     Objective:  BP 138/78   Pulse 69   Temp 98.4 F (36.9 C) (Temporal)   Ht 5\' 3"  (1.6 m)   Wt 113 lb 6.4 oz (51.4 kg)   SpO2 97%   BMI 20.09 kg/m  Gen: NAD, resting comfortably Skin: warm, dry, over lumbar spine there is a 2 c 2 cm raised lesion that is tender to touch. No obvious fluctuance. No erythema or warmth.     Assessment and Plan  #Subcutaneous lesion of the skin S:knot on lower back noticed about a week ago (started growing before covid vaccine). It has gotten larger in size. It is tender to touch and able to move around under the skin Denies any redness or warmth in area  no discharge from area.   Yesterday and day before started in last day or two. At the moment no pain but if lays on it or sits on it pain is at least a moderate level.  A/P: I suspect this is a cyst under the skin. Given location over spinal cord I was hesitant to intervene and in addition no obvious fluctuance for incision and drainage. With increasing size and pain with palpation patient would like to have appointment sooner rather than later- entered urgent referral  - doubt will need general anesthesia but patient can complete 4 mets of activity without chest pain or shortness of breath so would not need further cardiac work-up  #Health maintenance-got 1st covid vaccine!   Recommended follow up: scheduled in november Future Appointments  Date Time Provider [place]  07/21/2019  [facility]  04/23/2020 10:30 AM [facility] HEALTH COACH [facility]  04/23/2020 11:00 AM Marin Olp, MD [facility]    Lab/Order associations:   ICD-10-CM   1. Lesion of subcutaneous tissue  L98.9 Ambulatory referral to General Surgery   Return precautions advised.  Garret Reddish, MD

## 2019-07-03 NOTE — Patient Instructions (Addendum)
From our referral coordinator "they will review it and then call her with an appt - the phone number is [phone] - that way if she doesnt hear from them by Monday, then she can call and follow up to see if she can schedule an appt."

## 2019-07-04 DIAGNOSIS — L720 Epidermal cyst: Secondary | ICD-10-CM | POA: Diagnosis not present

## 2019-07-10 DIAGNOSIS — L729 Follicular cyst of the skin and subcutaneous tissue, unspecified: Secondary | ICD-10-CM | POA: Diagnosis not present

## 2019-07-17 DIAGNOSIS — L814 Other melanin hyperpigmentation: Secondary | ICD-10-CM | POA: Diagnosis not present

## 2019-07-17 DIAGNOSIS — D225 Melanocytic nevi of trunk: Secondary | ICD-10-CM | POA: Diagnosis not present

## 2019-07-17 DIAGNOSIS — L821 Other seborrheic keratosis: Secondary | ICD-10-CM | POA: Diagnosis not present

## 2019-07-17 DIAGNOSIS — Z86018 Personal history of other benign neoplasm: Secondary | ICD-10-CM | POA: Diagnosis not present

## 2019-07-17 DIAGNOSIS — Z808 Family history of malignant neoplasm of other organs or systems: Secondary | ICD-10-CM | POA: Diagnosis not present

## 2019-07-17 DIAGNOSIS — Z23 Encounter for immunization: Secondary | ICD-10-CM | POA: Diagnosis not present

## 2019-07-17 DIAGNOSIS — Z85828 Personal history of other malignant neoplasm of skin: Secondary | ICD-10-CM | POA: Diagnosis not present

## 2019-07-17 DIAGNOSIS — L578 Other skin changes due to chronic exposure to nonionizing radiation: Secondary | ICD-10-CM | POA: Diagnosis not present

## 2019-07-17 DIAGNOSIS — L570 Actinic keratosis: Secondary | ICD-10-CM | POA: Diagnosis not present

## 2019-07-21 ENCOUNTER — Ambulatory Visit: Payer: Medicare Other | Attending: Internal Medicine

## 2019-07-21 DIAGNOSIS — Z23 Encounter for immunization: Secondary | ICD-10-CM

## 2019-07-21 NOTE — Progress Notes (Signed)
   Covid-19 Vaccination Clinic  Name:  KIYRA FAUROT    MRN: [id number] DOB: [birth year]  07/21/2019  Ms. Shivley was observed post Covid-19 immunization for 15 minutes without incidence. She was provided with Vaccine Information Sheet and instruction to access the V-Safe system.   Ms. Standerfer was instructed to call 911 with any severe reactions post vaccine: Marland Kitchen Difficulty breathing  . Swelling of your face and throat  . A fast heartbeat  . A bad rash all over your body  . Dizziness and weakness    Immunizations Administered    Name Date Dose VIS Date Route   Pfizer COVID-19 Vaccine 07/21/2019  9:00 AM 0.3 mL 05/23/2019 Intramuscular   Manufacturer: [place]   Lot: [id number]   [place]: [id number]

## 2019-07-28 ENCOUNTER — Ambulatory Visit: Payer: Medicare Other

## 2019-08-26 ENCOUNTER — Other Ambulatory Visit: Payer: Self-pay | Admitting: Obstetrics and Gynecology

## 2019-08-26 DIAGNOSIS — E2839 Other primary ovarian failure: Secondary | ICD-10-CM

## 2019-08-26 DIAGNOSIS — Z01419 Encounter for gynecological examination (general) (routine) without abnormal findings: Secondary | ICD-10-CM | POA: Diagnosis not present

## 2019-08-26 DIAGNOSIS — Z124 Encounter for screening for malignant neoplasm of cervix: Secondary | ICD-10-CM | POA: Diagnosis not present

## 2019-08-26 LAB — HM PAP SMEAR: HM Pap smear: NEGATIVE

## 2019-09-01 ENCOUNTER — Encounter: Payer: Self-pay | Admitting: Family Medicine

## 2019-09-03 ENCOUNTER — Encounter: Payer: Self-pay | Admitting: Family Medicine

## 2019-09-08 DIAGNOSIS — L820 Inflamed seborrheic keratosis: Secondary | ICD-10-CM | POA: Diagnosis not present

## 2019-09-10 DIAGNOSIS — D239 Other benign neoplasm of skin, unspecified: Secondary | ICD-10-CM | POA: Diagnosis not present

## 2019-09-10 DIAGNOSIS — H2513 Age-related nuclear cataract, bilateral: Secondary | ICD-10-CM | POA: Diagnosis not present

## 2019-09-10 DIAGNOSIS — H18453 Nodular corneal degeneration, bilateral: Secondary | ICD-10-CM | POA: Diagnosis not present

## 2019-09-30 ENCOUNTER — Encounter: Payer: Self-pay | Admitting: Family Medicine

## 2019-10-21 ENCOUNTER — Other Ambulatory Visit: Payer: Self-pay

## 2019-10-21 ENCOUNTER — Ambulatory Visit (INDEPENDENT_AMBULATORY_CARE_PROVIDER_SITE_OTHER): Payer: Medicare Other | Admitting: Family Medicine

## 2019-10-21 ENCOUNTER — Encounter: Payer: Self-pay | Admitting: Family Medicine

## 2019-10-21 VITALS — BP 130/78 | HR 87 | Ht 63.0 in | Wt 116.2 lb

## 2019-10-21 DIAGNOSIS — G43109 Migraine with aura, not intractable, without status migrainosus: Secondary | ICD-10-CM

## 2019-10-21 DIAGNOSIS — R413 Other amnesia: Secondary | ICD-10-CM | POA: Diagnosis not present

## 2019-10-21 LAB — CBC WITH DIFFERENTIAL/PLATELET
Basophils Absolute: 0 10*3/uL (ref 0.0–0.1)
Basophils Relative: 0.2 % (ref 0.0–3.0)
Eosinophils Absolute: 0 10*3/uL (ref 0.0–0.7)
Eosinophils Relative: 0.2 % (ref 0.0–5.0)
HCT: 38 % (ref 36.0–46.0)
Hemoglobin: 12.9 g/dL (ref 12.0–15.0)
Lymphocytes Relative: 30.5 % (ref 12.0–46.0)
Lymphs Abs: 1.5 10*3/uL (ref 0.7–4.0)
MCHC: 34 g/dL (ref 30.0–36.0)
MCV: 86.5 fl (ref 78.0–100.0)
Monocytes Absolute: 0.4 10*3/uL (ref 0.1–1.0)
Monocytes Relative: 7.5 % (ref 3.0–12.0)
Neutro Abs: 2.9 10*3/uL (ref 1.4–7.7)
Neutrophils Relative %: 61.6 % (ref 43.0–77.0)
Platelets: 186 10*3/uL (ref 150.0–400.0)
RBC: 4.4 Mil/uL (ref 3.87–5.11)
RDW: 13.3 % (ref 11.5–15.5)
WBC: 4.8 10*3/uL (ref 4.0–10.5)

## 2019-10-21 LAB — COMPREHENSIVE METABOLIC PANEL
ALT: 10 U/L (ref 0–35)
AST: 20 U/L (ref 0–37)
Albumin: 4.7 g/dL (ref 3.5–5.2)
Alkaline Phosphatase: 74 U/L (ref 39–117)
BUN: 12 mg/dL (ref 6–23)
CO2: 32 mEq/L (ref 19–32)
Calcium: 9.9 mg/dL (ref 8.4–10.5)
Chloride: 102 mEq/L (ref 96–112)
Creatinine, Ser: 0.76 mg/dL (ref 0.40–1.20)
GFR: 75.87 mL/min (ref >60.00)
Glucose, Bld: 99 mg/dL (ref 70–99)
Potassium: 4 mEq/L (ref 3.5–5.1)
Sodium: 140 mEq/L (ref 135–145)
Total Bilirubin: 0.4 mg/dL (ref 0.2–1.2)
Total Protein: 7.3 g/dL (ref 6.0–8.3)

## 2019-10-21 LAB — VITAMIN B12: Vitamin B-12: 328 pg/mL (ref 211–911)

## 2019-10-21 LAB — TSH: TSH: 2.04 u[IU]/mL (ref 0.35–4.50)

## 2019-10-21 NOTE — Patient Instructions (Addendum)
Health Maintenance Due  Topic Date Due  . MAMMOGRAM - Sign release of information at the check out desk for last mammogram 08/07/2019   We will call you within two weeks about your referral to neurology at [place]. If you do not hear within 3 weeks, give [place] a call.   If you had new or worsening headaches before next visit please contact us immediately  Please stop by lab before you go If you have mychart- we will send your results within 3 business days of [place] receiving them.  If you do not have mychart- we will call you about results within 5 business days of [place] receiving them.   Recommended follow up: 2-3 month follow up if you dont feel you are getting answers you need from neurology or have further questions for me

## 2019-10-21 NOTE — Progress Notes (Signed)
Phone [phone] In person visit    I,Donna Orphanos,acting as a scribe for Garret Reddish, MD.,have documented all relevant documentation on the behalf of Garret Reddish, MD,as directed by  Garret Reddish, MD while in the presence of Garret Reddish, MD.  Subjective:   Joanne Brock is a 69 y.o. year old very pleasant female patient who presents for/with See problem oriented charting Chief Complaint  Patient presents with  . Migraine   This visit occurred during the SARS-CoV-2 public health emergency.  Safety protocols were in place, including screening questions prior to the visit, additional usage of staff PPE, and extensive cleaning of exam room while observing appropriate contact time as indicated for disinfecting solutions.   Past Medical History-  Patient Active Problem List   Diagnosis Date Noted  . History of adenomatous polyp of colon 02/20/2017    Priority: Medium  . Hyperlipidemia 02/08/2017    Priority: Medium  . Migraines     Priority: Medium  . Vitamin D deficiency 02/20/2017    Priority: Low  . History of skin cancer     Priority: Low  . Morton neuroma, left 10/02/2017  . Osteopenia 04/16/2017    Medications- reviewed and updated Current Outpatient Medications  Medication Sig Dispense Refill  . Multiple Vitamin (MULTIVITAMIN) capsule Take 1 capsule by mouth daily.    . rizatriptan (MAXALT) 10 MG tablet Take 10 mg by mouth as needed for migraine. May repeat in 2 hours if needed     No current facility-administered medications for this visit.     Objective:  BP 130/78 (BP Location: Left Arm, Patient Position: Sitting, Cuff Size: Normal)   Pulse 87   Ht 5\' 3"  (1.6 m)   Wt 116 lb 4 oz (52.7 kg)   BMI 20.59 kg/m  Gen: NAD, resting comfortably CV: RRR no murmurs rubs or gallops Lungs: CTAB no crackles, wheeze, rhonchi Ext: no edema Skin: warm, dry Neuro: CN II-XII intact, sensation and reflexes normal throughout, 5/5 muscle strength in bilateral  upper and lower extremities. Normal finger to nose. Normal rapid alternating movements. No pronator drift. Normal romberg. Normal gait.      Assessment and Plan   #Migraines #Memory concern S:Pt c/o increase in migraines for the past 3 months. She is having them every 7-10 days they were usually just on the right temporal area but now she is having them on the left temporal area. Does has light sensitivity at times, denies nausea. Pt is taking Maxalt as needed with relief.Pt also c/o dizziness off and on lately- very episodic and not long lasting.   Had been on topamax in past and opted to wean off. Even after coming off had episode about once every 6 weeks and much more frequent as above.   Last visit had some concerns about memory and we were planning on in person MMSE as well as depression follow up, b12, tsh, cbc, cmp - declines hiv, rpr testing. She feels like her ability to find words has been worse since last visit in January- she feels like she is declining in her abilities. Husband states he was gone a lot until 2019- he thinks may have started to see issues in 2019 and worsening in last few maths- started with difficulty of ability to multiply in head. Husband has not seen a recent dip over last few months other than with headaches he feels like memory is worse.  Husband had seen an article about diet improving memory loss- she is looking to  joint this.  A/P: 69 year old female with memory loss and worsening migraine pattern- now more on right side . Mom with dementia that was thought to be TIA related. Possible dementia vs. Mild cognitive impairment -  PHQ2 of 0 today. Doubt depression as cause - bloodwork with b12, tsh, cbc, cmp - declines hiv, rpr today -mri brain ordered today -will refer to neurology at [facility] due to patient request - hold off on change of treatment for migraines until neuro eval  Recommended follow up: 2-3 month follow up if you dont feel you are getting  answers you need from neurology or have further questions for me Future Appointments  Date Time Provider [place]  11/12/2019  1:00 PM [facility] DX DEXA 1 [facility]  04/23/2020 10:30 AM [facility] HEALTH COACH [facility]  04/23/2020 11:00 AM Marin Olp, MD [facility]    Lab/Order associations:   ICD-10-CM   1. Migraine with aura and without status migrainosus, not intractable  G43.109 Ambulatory referral to Neurology    CANCELED: Ambulatory referral to Neurology  2. Memory loss  R41.3 MR Brain Wo Contrast    Comprehensive metabolic panel    CBC with Differential/Platelet    Vitamin B12    TSH    Ambulatory referral to Neurology    CANCELED: Ambulatory referral to Neurology    Time Spent: 34 minutes of total time (1:08 PM- 1:42 PM) was spent on the date of the encounter performing the following actions: chart review prior to seeing the patient, obtaining history, performing a medically necessary exam, counseling on the treatment plan, placing orders, and documenting in our EHR.   The entirety of the information documented in the History of Present Illness, Review of Systems and Physical Exam were personally obtained by me. Portions of this information were initially documented by the CMA and reviewed by me for thoroughness and accuracy.   Garret Reddish, MD    Return precautions advised.  Garret Reddish, MD

## 2019-10-27 ENCOUNTER — Encounter: Payer: Self-pay | Admitting: Family Medicine

## 2019-11-03 ENCOUNTER — Telehealth: Payer: Self-pay | Admitting: Family Medicine

## 2019-11-03 ENCOUNTER — Encounter: Payer: Self-pay | Admitting: Neurology

## 2019-11-03 DIAGNOSIS — R413 Other amnesia: Secondary | ICD-10-CM

## 2019-11-03 NOTE — Telephone Encounter (Signed)
Spoke with husband.  He and his wife are things over and they would like to establish locally with neurology-referral to Dr. Tomi Likens was placed today  Admitted to [facility] study on memory and diet

## 2019-11-12 ENCOUNTER — Other Ambulatory Visit: Payer: Self-pay

## 2019-11-12 ENCOUNTER — Ambulatory Visit
Admission: RE | Admit: 2019-11-12 | Discharge: 2019-11-12 | Disposition: A | Discharge status: Home / Self Care | Payer: Medicare Other | Source: Ambulatory Visit | Attending: Obstetrics and Gynecology | Admitting: Obstetrics and Gynecology

## 2019-11-12 DIAGNOSIS — Z780 Asymptomatic menopausal state: Secondary | ICD-10-CM | POA: Diagnosis not present

## 2019-11-12 DIAGNOSIS — E2839 Other primary ovarian failure: Secondary | ICD-10-CM

## 2019-11-12 DIAGNOSIS — M8589 Other specified disorders of bone density and structure, multiple sites: Secondary | ICD-10-CM | POA: Diagnosis not present

## 2019-11-19 ENCOUNTER — Ambulatory Visit
Admission: RE | Admit: 2019-11-19 | Discharge: 2019-11-19 | Disposition: A | Discharge status: Home / Self Care | Payer: Medicare Other | Source: Ambulatory Visit | Attending: Family Medicine | Admitting: Family Medicine

## 2019-11-19 ENCOUNTER — Other Ambulatory Visit: Payer: Self-pay

## 2019-11-19 DIAGNOSIS — R413 Other amnesia: Secondary | ICD-10-CM | POA: Diagnosis not present

## 2019-12-24 DIAGNOSIS — H2513 Age-related nuclear cataract, bilateral: Secondary | ICD-10-CM | POA: Diagnosis not present

## 2019-12-24 DIAGNOSIS — H18453 Nodular corneal degeneration, bilateral: Secondary | ICD-10-CM | POA: Diagnosis not present

## 2019-12-24 DIAGNOSIS — L718 Other rosacea: Secondary | ICD-10-CM | POA: Diagnosis not present

## 2019-12-24 DIAGNOSIS — D239 Other benign neoplasm of skin, unspecified: Secondary | ICD-10-CM | POA: Diagnosis not present

## 2020-01-27 ENCOUNTER — Ambulatory Visit (INDEPENDENT_AMBULATORY_CARE_PROVIDER_SITE_OTHER): Payer: Medicare Other | Admitting: Neurology

## 2020-01-27 ENCOUNTER — Other Ambulatory Visit: Payer: Self-pay

## 2020-01-27 ENCOUNTER — Encounter: Payer: Self-pay | Admitting: Neurology

## 2020-01-27 VITALS — BP 153/88 | HR 97 | Ht 63.0 in | Wt 114.0 lb

## 2020-01-27 DIAGNOSIS — R413 Other amnesia: Secondary | ICD-10-CM

## 2020-01-27 NOTE — Progress Notes (Signed)
NEUROLOGY CONSULTATION NOTE  [name] MRN: [id number] DOB: February 15, 1951  Referring provider: Dr. Garret Reddish Primary care provider: Dr. Garret Reddish  Reason for consult:  Memory loss, migraines  Dear Dr Yong Channel:  Thank you for your kind referral of Joanne Brock for consultation of the above symptoms. Although her history is well known to you, please allow me to reiterate it for the purpose of our medical record. The patient was accompanied to the clinic by her husband who also provides collateral information. Records and images were personally reviewed where available.   HISTORY OF PRESENT ILLNESS: This is a pleasant 69 year old right-handed woman with a history of skin cancer, migraines, nephrolithiasis, presenting for evaluation of memory loss and worsening migraines. She feels like memory issues do not affect her on a regular basis, and started noticing changes a few months ago. Her husband started noticing changes towards the last quarter of 2019. She is a retired Engineer, maintenance (IT) and would be able to do simple math in her head, but started using the calculator more. She started noticing that calculations she could do were not coming easily, now taking her a longer time. She used to do the books in 45 minutes, and started taking 6 hours to complete. Her husband took over 3-4 months ago. She was not missing any payments. She denies getting lost driving, leaving the stove on, misplacing things. She is not on any regular medications, she takes prn Maxalt for migraine rescue. Her husband feels changes have been overall stable since 2019 but noticed she is working around them more. Sometimes she asks the same question, especially with time, repeatedly asking when it is time for them to leave. She has occasional word-finding issues. Her mother had memory loss consistent with dementia. Her father had a dementing illness and drug-induced Parkinson's disease but did not have memory issues. No history of  significant head injuries or alcohol use.  She started having migraines in her 59s, with pressure over the right temporal region majority of the time. Occasionally she has left temporal pain. No associated nausea/vomiting, photo/phonophobia, visual obscurations. She takes prn Maxalt with good effect. She was having migraines more frequently last May, but this has quieted down, she does not need to take Maxalt every month. She denies any dizziness, diplopia, dysarthria/dysphagia, neck/back pain, focal numbness/tingling/weakness, bowel/bladder dysfunction, anosmia, or tremors. Mood is good. No paranoia or hallucinations, no personality changes. Sleep is good. She retired in January 2018.  I personally reviewed MRI brain without contrast done 11/2019 which did not show any acute changes. There was mild diffuse atrophy and a few punctate foci of T2 hyperintensity, within normal limits for age.   Laboratory Data: Lab Results  Component Value Date   TSH 2.04 10/21/2019   Lab Results  Component Value Date   [id number] 10/21/2019     PAST MEDICAL HISTORY: Past Medical History:  Diagnosis Date  . Allergy   . Cancer ([place])    basal cell and squamous cell skin cancer  . History of skin cancer    squamous and basal x 8 in 2018. Dr. Renda Rolls. Had been with Dr. Tonia Brooms.   . Kidney stone    x1  . Migraines    Aura- smell, spots. once a month- sumatriptan. topamax 50mg  once a day prophylaxis.   . Osteopenia     PAST SURGICAL HISTORY: Past Surgical History:  Procedure Laterality Date  . COLONOSCOPY    . TONSILLECTOMY Bilateral 1960  MEDICATIONS: Current Outpatient Medications on File Prior to Visit  Medication Sig Dispense Refill  . Multiple Vitamin (MULTIVITAMIN) capsule Take 1 capsule by mouth daily.    . rizatriptan (MAXALT) 10 MG tablet Take 10 mg by mouth as needed for migraine. May repeat in 2 hours if needed     No current facility-administered medications on file prior to  visit.    ALLERGIES: No Known Allergies  FAMILY HISTORY: Family History  Problem Relation Age of Onset  . Hypertension Mother   . Stroke Mother        TIAs- declined over years and stroke eventually. died 60  . Other Father        decline after ? heat stroke. died around 53  . Stroke Sister        96. nonsmoker.   Marland Kitchen Ulcerative colitis Daughter   . Colon cancer Neg Hx   . Colon polyps Neg Hx   . Esophageal cancer Neg Hx   . Stomach cancer Neg Hx   . Rectal cancer Neg Hx     SOCIAL HISTORY: Social History   Socioeconomic History  . Marital status: Married    Spouse name: Not on file  . Number of children: Not on file  . Years of education: Not on file  . Highest education level: Not on file  Occupational History  . Not on file  Tobacco Use  . Smoking status: Never Smoker  . Smokeless tobacco: Never Used  Vaping Use  . Vaping Use: Never used  Substance and Sexual Activity  . Alcohol use: No    Alcohol/week: 0.0 standard drinks  . Drug use: No  . Sexual activity: Yes  Other Topics Concern  . Not on file  Social History Narrative   Married over 40 years in 2018. 1 daughter 41 years old in 2018. No grandkids- 2 grandpets.    Had to spend great deal of time caring for aging parents- now passed.       Undergrad at [employer].    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 [employer]: travel   Right Handed   One Story Home   Does drink caffeine occasionally   Social Determinants of Health   Financial Resource Strain:   . Difficulty of Paying Living Expenses:   Food Insecurity:   . Worried About Charity fundraiser in the Last Year:   . Arboriculturist in the Last Year:   Transportation Needs:   . Film/video editor (Medical):   Marland Kitchen Lack of Transportation (Non-Medical):   Physical Activity:   . Days of Exercise per Week:   . Minutes of Exercise per Session:   Stress:   . Feeling of Stress :   Social Connections:   . Frequency of  Communication with Friends and Family:   . Frequency of Social Gatherings with Friends and Family:   . Attends Religious Services:   . Active Member of Clubs or Organizations:   . Attends Archivist Meetings:   Marland Kitchen Marital Status:   Intimate Partner Violence:   . Fear of Current or Ex-Partner:   . Emotionally Abused:   Marland Kitchen Physically Abused:   . Sexually Abused:     PHYSICAL EXAM: Vitals:   01/27/20 0848  BP: (!) 153/88  Pulse: 97  SpO2: 98%   General: No acute distress Head:  Normocephalic/atraumatic Skin/Extremities: No rash, no edema Neurological Exam: Mental status: alert and oriented  to person, place, and time, no dysarthria or aphasia, Fund of knowledge is appropriate.  Remote memory are intact.  Attention and concentration are reduced.  SLUMS score 15/30.  [employer] Mental Exam 01/27/2020  Weekday Correct 1  Current year 1  What state are we in? 1  Amount spent 0  Amount left 0  # of Animals 1  5 objects recall 1  Number series 1  Hour markers 2  Time correct 1  Placed X in triangle correctly 1  Largest Figure 1  Name of female 2  Date back to work 0  Type of work 2  State she lived in 0  Total score 15    Cranial nerves: CN I: not tested CN II: pupils equal, round and reactive to light, visual fields intact CN III, IV, VI:  full range of motion, no nystagmus, no ptosis CN V: facial sensation intact CN VII: upper and lower face symmetric CN VIII: hearing intact to conversation CN XI: sternocleidomastoid and trapezius muscles intact CN XII: tongue midline Bulk & Tone: normal, no fasciculations, no cogwheeling Motor: 5/5 throughout with no pronator drift. Sensation: intact to light touch, cold, pin, vibration and joint position sense.  No extinction to double simultaneous stimulation.  Romberg test negative Deep Tendon Reflexes: +1 throughout Cerebellar: no incoordination on finger to nose testing Gait: narrow-based and steady, able to  tandem walk adequately. Tremor: none  IMPRESSION: This is a pleasant 69 year old right-handed woman with a history of skin cancer, migraines, nephrolithiasis, presenting for evaluation of memory loss and worsening migraines. Her neurological exam is normal, SLUMS score today 15/30. She is overall managing complex tasks but certainly concerning that as a retired Engineer, maintenance (IT) she is having difficulties with calculations. MRI brain no acute changes. She will be scheduled for Neurocognitive testing to further evaluate cognitive concerns. Migraines have quieted down, no indication to start daily preventative medication at this time, continue prn Maxalt. We discussed the importance of control of vascular risk factors, physical exercise, and brain stimulation exercises for brain health. Follow-up after testing, they know to call for any changes.    Thank you for allowing me to participate in the care of this patient. Please do not hesitate to call for any questions or concerns.   Ellouise Newer, M.D.  CC: Dr. Yong Channel

## 2020-01-27 NOTE — Patient Instructions (Signed)
Great to meet you! Schedule Neurocognitive testing. Follow-up after testing, call for any changes.   RECOMMENDATIONS FOR ALL PATIENTS WITH MEMORY PROBLEMS: 1. Continue to exercise (Recommend 30 minutes of walking everyday, or 3 hours every week) 2. Increase social interactions - continue going to [place] and enjoy social gatherings with friends and family 3. Eat healthy, avoid fried foods and eat more fruits and vegetables 4. Maintain adequate blood pressure, blood sugar, and blood cholesterol level. Reducing the risk of stroke and cardiovascular disease also helps promoting better memory. 5. Avoid stressful situations. Live a simple life and avoid aggravations. Organize your time and prepare for the next day in anticipation. 6. Sleep well, avoid any interruptions of sleep and avoid any distractions in the bedroom that may interfere with adequate sleep quality 7. Avoid sugar, avoid sweets as there is a strong link between excessive sugar intake, diabetes, and cognitive impairment We discussed the Mediterranean diet, which has been shown to help patients reduce the risk of progressive memory disorders and reduces cardiovascular risk. This includes eating fish, eat fruits and green leafy vegetables, nuts like almonds and hazelnuts, walnuts, and also use olive oil. Avoid fast foods and fried foods as much as possible. Avoid sweets and sugar as sugar use has been linked to worsening of memory function.

## 2020-01-28 ENCOUNTER — Encounter: Payer: Self-pay | Admitting: Family Medicine

## 2020-02-25 ENCOUNTER — Ambulatory Visit (INDEPENDENT_AMBULATORY_CARE_PROVIDER_SITE_OTHER): Payer: Medicare Other | Admitting: Counselor

## 2020-02-25 ENCOUNTER — Ambulatory Visit: Payer: Medicare Other

## 2020-02-25 ENCOUNTER — Encounter: Payer: Self-pay | Admitting: Counselor

## 2020-02-25 ENCOUNTER — Other Ambulatory Visit: Payer: Self-pay

## 2020-02-25 DIAGNOSIS — F09 Unspecified mental disorder due to known physiological condition: Secondary | ICD-10-CM

## 2020-02-25 DIAGNOSIS — R482 Apraxia: Secondary | ICD-10-CM

## 2020-02-25 NOTE — Progress Notes (Signed)
[facility]  Brock Name: Joanne Brock MRN: [id number] Date of Birth: 1951/01/31 Age: 69 y.o. Education: 16 years  Referral Circumstances and Background Information  Joanne Brock is a 69 y.o., right-hand dominant, married woman with a history of migraines and memory loss that her husband noticed toward Joanne end of 2019. Joanne Brock herself noticed Joanne problems only a few months ago. She was recently seen by Dr. Delice Lesch, who noted a SLUMS of 15/30, memory problems, dyscalculia and referred her for neuropsychological testing.   On interview, Joanne Brock seemed unsure about her cognitive problems, she said that she has noticed them over Joanne past few months or a little more than that but she is not particularly bothered by them. Joanne first changes noticed by her husband were that she was repeating Joanne same questions in a short period of time toward Joanne end of 2019, although he said that they have gotten used to it and it isn't particularly functionally impactful. He also noticed a diminution of her ability to do mental arithmetic, as evidenced by taking a much longer time to balance Joanne checkbook. She eventually turned Joanne checkbook over to her husband, which "must have been really scary for her," she had always done it in Joanne past. He thinks she could still do it although "it might take her three days." I asked about orientation and interestingly, her husband thinks that there has been some reduction in her ability to use a clock to tell time and as a result, she asks about what time they have to leave repetitively. She denied any other signs suggestive of visuospatial problems such as difficulties navigating forms visually, with reading, reading Joanne menu/board at restaurants, or using computers or smart phones. She has noticed no problems with skilled motor behaviors such as tying her shoes or using silverware, and there are no changes in hardwriting. She has  no gait changes and walks frequently, there are no tremors, or moving very slowly. She has no falls. She has some minor word finding problems but they don't seem worse than in Joanne past or as compared to other people her age and she has no difficulties making speech sounds or with understanding other people. She has no problems doing multistep things, such as in Joanne kitchen. She has no visual hallcuinations, personality changes, hoarding or collecting behaviors, or excessive anxiety. She reported that her spirits are generally good and she is sleeping well. She has no dream enactment behavior. She is eating normally for her with no significant weight gain or weight loss. She reported that her energy is good.   With respect to activities, she is still doing well in most areas. They have a trail near their house and they walk regularly, typically about 2 miles a day. They notice no significant functional changes other than Joanne aforementioned difficulties with balancing Joanne checkbook. She thinks she is still able to cook, although she doesn't cook frequently. She is still driving without incident, managing her own medications, and has no problems with any of those things. He has noticed no problems with her judgment and problem solving, and they typically are articulate with one another regarding their decisions so he feels like he has good insight into that.    Past Medical History and Review of Relevant Studies   Brock Active Problem List   Diagnosis Date Noted  . Morton neuroma, left 10/02/2017  . Osteopenia 04/16/2017  . Vitamin D deficiency 02/20/2017  .  History of adenomatous polyp of colon 02/20/2017  . Hyperlipidemia 02/08/2017  . History of skin cancer   . Migraines    Review of Neuroimaging and Relevant Medical History: Joanne Brock has an MRI brain from 11/18/2017 that shows mild volume loss. There may be a bit more in Joanne frontal and parietal areas than in other regions and perhaps a bit  more than expected for age but it is hardly striking and is not clearly pathologic in extent. No significant leukoaraiosis.  Current Outpatient Medications  Medication Sig Dispense Refill  . Multiple Vitamin (MULTIVITAMIN) capsule Take 1 capsule by mouth daily.    . rizatriptan (MAXALT) 10 MG tablet Take 10 mg by mouth as needed for migraine. May repeat in 2 hours if needed     No current facility-administered medications for this visit.   Family History  Problem Relation Age of Onset  . Hypertension Mother   . Stroke Mother        TIAs- declined over years and stroke eventually. died 32  . Other Father        decline after ? heat stroke. died around 49  . Stroke Sister        20. nonsmoker.   Marland Kitchen Ulcerative colitis Daughter   . Colon cancer Neg Hx   . Colon polyps Neg Hx   . Esophageal cancer Neg Hx   . Stomach cancer Neg Hx   . Rectal cancer Neg Hx    There is a family history of dementia. Her father developed some type of dementing condition, which developed "pretty quickly" around age 40. Joanne Brock's mother also "had to be put in a facility," and she developed issues in her 4s also, they think it was vascular dementia. There is some family history of stroke in her grandmother. There is no  family history of psychiatric illness.  Psychosocial History  Developmental, Educational and Employment History: Joanne Brock's family was in [place] [employer] and she was born in [place]. They lived "various places overseas," and then eventually moved to Jo[place] area around 1st grade, so she did most of her education stateside. She reported that she was a good student who was never held back and didn't have any learning problems. She went on to earn a bachelor's degree and had a hard time recalling Joanne name of Joanne school (it was [employer]). 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. She retired around Joanne beginning of 2018 and denied that memory problems were Joanne reason for her retiring.   Psychiatric History: Denied by Joanne Brock.  Substance Use History: Joanne Brock does not drink, she does not smoke, and she does not use any substances.   Relationship History and Living Cimcumstances: Joanne Brock and her husband have been married for 16 years, they have one child, a daughter, who lives in [place]. They have a good relationship. They don't know that she is aware of problems with memory and thinking.   Mental Status and Behavioral Observations  Sensorium/Arousal: Joanne Brock's level of arousal was awake and alert. Hearing and vision were adequate for testing purposes. Orientation: Joanne Brock was alert and fully oriented to person, place, time, and date. Appearance: Joanne Brock was neatly dressed in appropriate, casual clothing with reasonable grooming and hygiene.  Behavior: Pleasant and appropriate.  Speech/language: Joanne Brock demonstrated Joanne  odd semantic paraphasia and occasional word finding problems. Speech was otherwise normal in rate, rhythm, and volume.  Gait/Posture: Narrow based and steady on exam with Dr. Delice Lesch.  Movement: Normal tone and no suggestion of Parkinsonism on exam with Dr. Delice Lesch.  Cortical Motor/Sensory Functioning: Upper limb praxis was assessed for hammering, screw driver use, scrambling, and scissor use and was impaired bilaterally with 2/4 on Joanne right and 1/4 on Joanne left. She made content and hand position errors on scissor use bilaterally and significant hand position and spatial errors with a clumsy movement quality on screw driver use and scrambling on Joanne left. Finger gnosis was borderline with 2/4 on Joanne left and 4/4 on Joanne right (had to move fingers to tell). Graphesthesia was 3/4 bilaterally.  Social Comportment: Pleasant and appropriate Mood: "Pretty good!" Affect: Mainly  euthymic Thought process/content: Thought process was logical and goal-oriented although she did have a hard time retrieving major aspects of her personal history, such as where she went to college and Joanne specifics of what she did for work.  Safety: No safety concerns were identified at this visit.  Insight: Questionable  Montreal Cognitive Assessment  02/25/2020  Visuospatial/ Executive (0/5) 2  Naming (0/3) 2  Attention: Read list of digits (0/2) 1  Attention: Read list of letters (0/1) 1  Attention: Serial 7 subtraction starting at 100 (0/3) 0  Language: Repeat phrase (0/2) 2  Language : Fluency (0/1) 0  Abstraction (0/2) 1  Delayed Recall (0/5) 0  Orientation (0/6) 5  Total 14  Adjusted Score (based on education) 14   Test Procedures  Wide Range Achievement Test - 4             Word Reading Neuropsychological Assessment Battery  List Learning  Story Learning  Daily Living Memory  Naming  Digit Span Repeatable Battery for Joanne Assessment of Neuropsychological Status (Form A)  Figure Copy  Judgment of Line Orientation  Coding  Figure Recall Joanne Dot Counting Test A Random Letter Test Controlled Oral Word Association (F-A-S) Semantic Fluency (Animals) Trail Making Test A & B Complex Ideational Material Modified [place] Card Sorting Test Geriatric Depression Scale - Short Form Quick Dementia Rating System (completed by husband, Gershon Mussel)  Plan  SAMERA MACY was seen for a psychiatric diagnostic evaluation and neuropsychological testing. She has a history of fairly minor memory problems and dyscalculia starting in 2019 and at this point, her husband has taken over Joanne finances. On exam, she is screening at an impaired level on Joanne MoCA with a 14/30, which is abnormal given her education level. She has an MRI with a subtle impression of more atrophy than expected for age, perhaps a bit more in Joanne frontal and parietal areas but it is far from striking and that is a soft call.  She also has some cortical motor/sensory signs (L > R), although that is also her nondominant hand. Full and complete note with impressions, recommendations, and interpretation of test data to follow.   Viviano Simas Nicole Kindred, PsyD, [place] Clinical Neuropsychologist  Informed Consent and Coding/Compliance  Risks and benefits of Joanne evaluation were discussed with Joanne Brock prior to all testing procedures. I conducted a clinical interview and neuropsychological testing (at least two tests) with Joanne Brock and Joanne Brock, B.S. (Technician) administered additional test procedures. Joanne Brock was able to tolerate Joanne testing procedures and Joanne Brock (and/or family if applicable) is likely to benefit from further follow up to receive Joanne diagnosis and treatment recommendations, which  will be rendered at Joanne next encounter. Billing below reflects technician time, my direct face-to-face time with Joanne Brock, time spent in test administration, and time spent in professional activities including but not limited to: neuropsychological test interpretation, integration of neuropsychological test data with clinical history, report preparation, treatment planning, care coordination, and review of diagnostically pertinent medical history or studies.   Services associated with this encounter: Clinical Interview [phone]) plus 60 minutes (59136; Neuropsychological Evaluation by Professional)  120 minutes (85992; Neuropsychological Evaluation by Professional, Adl.) 22 minutes (34144; Test Administration by Professional) 30 minutes (36016; Neuropsychological Testing by Technician) 70 minutes (58006; Neuropsychological Testing by Technician, Adl.)

## 2020-02-25 NOTE — Progress Notes (Signed)
Psychometrist Note   Cognitive testing was administered to Joanne Brock by Joanne Brock, B.S. (Technician) under the supervision of Alphonzo Severance, Psy.D., ABN. [name] was able to tolerate all test procedures. Dr. Nicole Kindred met with the patient as needed to manage any emotional reactions to the testing procedures. Rest breaks were offered.    The battery of tests administered was selected by Dr. Nicole Kindred with consideration to the patient's current level of functioning, the nature of her symptoms, emotional and behavioral responses during the interview, level of literacy, observed level of motivation/effort, and the nature of the referral question. This battery was communicated to the psychometrist. Communication between Dr. Nicole Kindred and the psychometrist was ongoing throughout the evaluation and Dr. Nicole Kindred was immediately accessible at all times. Dr. Nicole Kindred provided supervision to the technician on the date of this service, to the extent necessary to assure the quality of all services provided.    [name] will return in approximately one week for an interactive feedback session with Dr. Nicole Kindred, at which time test performance, clinical impressions, and treatment recommendations will be reviewed in detail. The patient understands she can contact our office should she require our assistance before this time.   A total of 100 minutes of billable time were spent with Joanne Brock by the technician, including test administration and scoring time. Billing for these services is reflected in Dr. Les Pou note.   This note reflects time spent with the psychometrician and does not include test scores, clinical history, or any interpretations made by Dr. Nicole Kindred. The full report will follow in a separate note.

## 2020-02-27 NOTE — Progress Notes (Signed)
[facility]  Patient Name: BRITTNY SPANGLE MRN: [id number] Date of Birth: 01/18/1951 Age: 69 y.o. Education: 16 years  Measurement properties of test scores: IQ, Index, and Standard Scores (SS): Mean = 100; Standard Deviation = 15 Scaled Scores (Ss): Mean = 10; Standard Deviation = 3 Z scores (Z): Mean = 0; Standard Deviation = 1 T scores (T); Mean = 50; Standard Deviation = 10  TEST SCORES:    Note: This summary of test scores accompanies the interpretive report and should not be interpreted by unqualified individuals or in isolation without reference to the report. Test scores are relative to age, gender, and educational history as available and appropriate.   Performance Validity        "A" Random Letter Test Raw  Descriptor      Errors 0 Within Expectation  The Dot Counting Test: 13 Within Expectation      Mental Status Screening     Total Score Descriptor  MoCA 14 Moderate Dementia      Expected Functioning        Wide Range Achievement Test: Standard/Scaled Score Percentile      Word Reading 101 53      Attention/Processing Speed        Neuropsychological Assessment Battery (Attention Module, Form 1): T-score Percentile      Digits Forward 50 50      Digits Backwards 40 16      Repeatable Battery for the Assessment of Neuropsychological Status (Form A): Scaled Score Percentile      Coding 2 <1      Language        Neuropsychological Assessment Battery (Language Module, Form 1): T-score Percentile      Naming   (23) 19 <1      Verbal Fluency: T-score Percentile      Controlled Oral Word Association (F-A-S) 28 2      Semantic Fluency (Animals) 6 <1      Memory:        Neuropsychological Assessment Battery (Memory Module, Form 1): T-score Percentile      List Learning           List A Immediate Recall   (8, 9, 9) 51 54         List B Immediate Recall   (3) 38 12         List A Short Delayed Recall   (0) 19 <1          List A Long Delayed Recall   (0) 19 <1         List A Percent Retention   (0 %) --- <1         List A Long Delayed Yes/No Recognition Hits   (12) --- 79         List A Long Delayed Yes/No Recognition False Alarms   (10) --- 2         List A Recognition Discriminability Index --- 5     Story Learning           Immediate Recall   (15, 17) 23 <1         Delayed Recall   (5) 25 1         Percent Retention   (29 %) --- <1      Daily Living Memory            Immediate Recall   (25, 10) 33 5  Delayed Recall   (1, 0) 19 <1          Percent Retention (37 %) --- <1          Recognition Hits    (2) --- <1      Repeatable Battery for the Assessment of Neuropsychological Status (Form A): Scaled Score Percentile         Figure Recall   (0) 1 <1      Visuospatial/Constructional Functioning        Repeatable Battery for the Assessment of Neuropsychological Status (Form A): Standard/Scaled Score Percentile     Visuospatial/Constructional Index 81 10         Figure Copy   (16) 6 9         Judgment of Line Orientation   (15) --- 26-50      Executive Functioning        Modified [employer] Test (MWCST): Standard/T-Score Percentile      Number of Categories Correct 25 1      Number of Perseverative Errors 35 7      Number of Total Errors 27 1      Percent Perseverative Errors 51 54  Executive Function Composite 67 1      Trail Making Test: T-Score Percentile      Part A 23 <1      Part B 11 <1      Boston Diagnostic Aphasia Exam: Raw Score Scaled Score      Complex Ideational Material 12 12      Clock Drawing Raw Score Descriptor      Command 6 Mild Impairment      Rating Scales        Clinical Dementia Rating Raw Score Descriptor      Sum of Boxes 3.5 Very Mild Dementia      Global Score 0.5 MCI      Quick Dementia Rating System Raw Score Descriptor      Sum of Boxes 2 MCI      Total Score 2.5 MCI  Geriatric Depression Scale - Short Form 1 Negative   Simora Dingee V.  Nicole Kindred PsyD, [place] Clinical Neuropsychologist

## 2020-03-02 NOTE — Progress Notes (Signed)
[facility]  Patient Name: Joanne Brock MRN: [id number] Date of Birth: 1950-10-25 Age: 69 y.o. Education: 16 years  Clinical Impressions  Joanne Brock is a 69 y.o., right-hand dominant, married woman with a history of migraines and memory loss that her husband noticed toward the end of 2019. Her day-to-day symptoms mainly involve dyscalculia, forgetfulness, and she is also having a difficult time reading clocks. 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 Her MRI shows volume loss with consideration of somewhat more in the frontal and parietal areas and the extent is a bit more than expected for age, there is no significant leukoaraiosis to speak of.   On neuropsychological examination, Joanne Brock demonstrated significant cognitive impairment including memory storage problems, deficient visual object confrontation naming, and extremely low scores on measures of processing speed and executive function. Interestingly, she performed relatively well on visual spatial tasks despite having parietal signs and symptoms including the aforementioned dyscalculia, apraxia (L>R) and some borderline finger agnosia and dysgraphesthesia. She did have qualitative difficulties navigating visual test forms and with scanning and was noted to omit figure details on a figure copy task. She screened negative for the presence of depression. Her husband rated her more at an MCI than a dementia level of function on severity status staging while her CDR suggests a very mild dementia level of function is the best estimate of her overall severity (she was having problems with the finances).    Joanne Brock is thus demonstrating a clinical dementia syndrome resembling Alzheimer's with additional parietal signs and symptoms (e.g., apraxia, finger agnosia, borderline graphesthesia,  dyscalculia), currently at a very mild level of progression. Her test data is more characteristic of expectations in a late mild to early moderate dementia level, but she and her husband report only minimal functional difficulties and it seems reasonable to give her the benefit of the doubt. My sense is that despite her atypical features, Alzheimer's is still the underlying problem. Often times earlier onset dementia presents with atypical features as seems to be the case for her.   Diagnostic Impressions: Alzheimer's dementia, late onset, without behavioral disturbance  Recommendations to be discussed with patient  Your performance and presentation on assessment was consistent with difficulties in several cognitive domains, including on measures of memory, executive functioning, processing speed, with mental calculation, and you also had some additional difficulties on non-cognitive testing including apraxia (difficulties doing skilled motor movements, such as pantomiming how to use tools). You and your husband reported that you have continued to function very well with things, with the exception of him needing to take over the finances. Considering all available information, I think that very mild dementia is the best term for your current level of difficulty.   Dementia refers to a group of syndromes where multiple areas of ability are damaged in the brain, such as memory, thinking, judgment, and behavior, and most commonly refers to age related causes of dementia that cause worsening in these abilities over time. Alzheimer's disease is the most common form of dementia in people over the age of 33. Not all dementias are Alzheimer's disease, but all Alzheimer's disease is dementia. When dementia is due to an underlying condition affecting the brain, such as Alzheimer's disease, there is progression over time, which typically procedes gradually over many years.   In your case, I think that  your dementia  is likely due to the most common cause of age-related cognitive decline, namely Alzheimer's disease. Alzheimer's disease is most often associated with an amnestic dementia syndrome although it can present in many different ways. Many of these presentations involve so-called "parietal" signs and symptoms, including apraxia, prominent difficulties with calculation, and with visual scanning. You have some of these atypical features but do not meet criteria for any of the other dementia syndromes and thus, I think that your dementia is due to Alzheimer's disease while acknowledging that it has some atypical features. Sometimes when dementia happens earlier, it presents with more atypical features, which may be the case for you.   You could consider discussing an antidementia medication with your PCP Dr. Yong Channel. This is a medication for memory and other thinking abilities. These medications are typically of limited benefit, they are not life changing, and they do not alter or stop the disease process but they can result in some mild symptomatic improvement.   I think it is appropriate that your husband is managing the finances and suggest that he continues to do so. I would also suggest that you get help with any other activities you are having difficulties with. You reported that you do not have difficulties with things such as managing your medications, using computers, or reading, although I suggest that your husband pay close attention to better assess that and provide help as needed.   There is now good quality evidence from at least one large scale study that a modified mediterranean diet may help slow cognitive decline. This is known as the "MIND" diet. The Mind diet is not so much a specific diet as it is a set of recommendations for things that you should and should not eat.   Foods that are ENCOURAGED on the MIND Diet:  Green, leafy vegetables: Aim for six or more servings per week. This includes kale,  spinach, cooked greens and salads.  All other vegetables: Try to eat another vegetable in addition to the green leafy vegetables at least once a day. It is best to choose non-starchy vegetables because they have a lot of nutrients with a low number of calories.  Berries: Eat berries at least twice a week. There is a plethora of research on strawberries, and other berries such as blueberries, raspberries and blackberries have also been found to have antioxidant and brain health benefits.  Nuts: Try to get five servings of nuts or more each week. The creators of the [place] don't specify what kind of nuts to consume, but it is probably best to vary the type of nuts you eat to obtain a variety of nutrients. Peanuts are a legume and do not fall into this category.  Olive oil: Use olive oil as your main cooking oil. There may be other heart-healthy alternatives such as algae oil, though there is not yet sufficient research upon which to base a formal recommendation.  Whole grains: Aim for at least three servings daily. Choose minimally processed grains like oatmeal, quinoa, brown rice, whole-wheat pasta and 100% whole-wheat bread.  Fish: Eat fish at least once a week. It is best to choose fatty fish like salmon, sardines, trout, tuna and mackerel for their high amounts of omega-3 fatty acids.  Beans: Include beans in at least four meals every week. This includes all beans, lentils and soybeans.  Poultry: Try to eat chicken or [place] at least twice a week. Note that fried chicken is not encouraged on the  MIND diet.  Wine: Aim for no more than one glass of alcohol daily. Both red and white wine may benefit the brain. However, much research has focused on the red wine compound resveratrol, which may help protect against Alzheimer's disease.  Foods that are DISCOURAGED on the MIND Diet: Butter and margarine: Try to eat less than 1 tablespoon (about 14 grams) daily. Instead, try using olive oil as your primary  cooking fat, and dipping your bread in olive oil with herbs.  Cheese: The MIND diet recommends limiting your cheese consumption to less than once per week.  Red meat: Aim for no more than three servings each week. This includes all beef, pork, lamb and products made from these meats.  [place] food: The MIND diet highly discourages fried food, especially the kind from fast-food restaurants. Limit your consumption to less than once per week.  Pastries and sweets: This includes most of the processed junk food and desserts you can think of. Ice cream, cookies, brownies, snack cakes, donuts, candy and more. Try to limit these to no more than four times a week.  Exercise is one of the best medicines for promoting health and maintaining cognitive fitness at all stages in life. Exercise probably has the largest documented effect on brain health and performance of any lifestyle intervention. Studies have shown that even previously sedentary individuals who start exercising as late as age 33 show a significant survival benefit as compared to their non-exercising peers. In the [place], the current guidelines are for 30 minutes of moderate exercise per day, but increasing your activity level less than that may also be helpful. You do not have to get your 30 minutes of exercise in one shot and exercising for short periods of time spread throughout the day can be helpful. Go for several walks, learn to dance, or do something else you enjoy that gets your body moving. Of course, if you have an underlying medical condition or there is any question about whether it is safe for you to exercise, you should consult a medical treatment provider prior to beginning exercise.   As with all capable adults, I would recommend that you consult with an elder care attorney regarding advanced directives if you have not, such as a healthcare power of attorney and living will. Consultation with an elder care attorney can help you  protect your estate and communicate your preferences to your loved ones formally, in the event you are not able to do so yourself. I can provide my strong recommendation for the [employer], who are located at [address] in [place], [place]. They can be reached at [phone]. They also have a website [email or web address].   Test Findings  Test scores are summarized in additional documentation associated with this encounter. Test scores are relative to age, gender, and educational history as available and appropriate. There were no concerns about performance as the patient appeared to be putting forth good effort and if anything wanted to impress upon the examiners how well she was doing.   General Intellectual Functioning/Achievement:  Performance on single word reading fell within the average range.   Attention and Processing Efficiency: Indicators of attention and working memory involving digit repetition showed average digit repetition forward and low average digit repetition backward. 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   With respect to processing efficiency, Joanne Brock demonstrated an extremely low score on timed  number-symbol coding and simple numeric sequencing. She had difficulties visually navigating the test forms.   Language: Performance on visual object confrontation naming was extremely low. Generation of words was extremely low in response to the letters F-A-S and she had even lower extremely low performance on generation of words in response to the category prompt "animals."  Visuospatial Function: Performance on visuospatial and constructional indicators fell at a low average level, which is a bit better than expected given her prominent parietal signs and symptoms, which are often accompanied by visuospatial dysfunction. Copy of a line drawing was unusually low and there were a few omitted  details. Judgment of angular line orientations was average. Qualitatively, she did have difficulties interacting with visual test forms and with visual scanning so she may have problems that were simply not well demonstrated by the testing.   Learning and Memory: Performance on measures of learning and memory suggested memory storage problems. She had difficulties acquiring and retaining information across time on both visually and verbally mediated tasks.    In the verbal realm, she learned 8, 9, and 9 words of a 12-item word list across three learning trials, which is a flat learning curve but an average performance as far as total words is concerned. Nevertheless, she did not recall any of those words on short or long delayed recall although she did do just a bit better on delayed recognition discriminability. Memory for a short story was extremely low on immediate and delayed recall. Daily living memory was unusually low on immediate recall and she recalled little information on delayed recall, generating an extremely low score.   In the visual realm, delayed recall for a modestly complex figure stimulus was extremely low (did not recall any details).   Executive Functions: Performance on executive measures was generally low with extremely low scores on generation of words in response to the letters F-A-S, on the [employer], and she did not complete the sample item on alternating sequencing of numbers and letters so the task was discontinued. She did better on reasoning with complex verbal information with an errorless score. Clock drawing was consistent with "moderate impairment," with intact face and numbers yet no clock hands.   Rating Scale(s): Joanne Brock screened negative for the presence of depression on screening assessment. Her husband rated her as functioning at an MCI to very mild dementia range. Her CDR suggests a very mild dementia level is the best  characterization although her cognitive test performance is somewhat lower than expected.   Joanne Simas Nicole Kindred PsyD, [place] Clinical Neuropsychologist

## 2020-03-03 ENCOUNTER — Ambulatory Visit (INDEPENDENT_AMBULATORY_CARE_PROVIDER_SITE_OTHER): Payer: Medicare Other | Admitting: Counselor

## 2020-03-03 ENCOUNTER — Other Ambulatory Visit: Payer: Self-pay

## 2020-03-03 DIAGNOSIS — G301 Alzheimer's disease with late onset: Secondary | ICD-10-CM

## 2020-03-03 DIAGNOSIS — R482 Apraxia: Secondary | ICD-10-CM

## 2020-03-03 DIAGNOSIS — F0280 Dementia in other diseases classified elsewhere without behavioral disturbance: Secondary | ICD-10-CM

## 2020-03-04 ENCOUNTER — Encounter: Payer: Self-pay | Admitting: Counselor

## 2020-03-04 DIAGNOSIS — F0280 Dementia in other diseases classified elsewhere without behavioral disturbance: Secondary | ICD-10-CM | POA: Insufficient documentation

## 2020-03-04 NOTE — Progress Notes (Signed)
   NEUROPSYCHOLOGY FEEDBACK NOTE [facility]  Telemedicine statement:  I discussed the limitations of neuropsychological care via telemedicine and the availability of in person appointments. The patient expressed understanding and agreed to proceed. The patient was verified with two identifiers.  The visit modality was: telephonic The patient location was: home The provider location was: office  The following individuals participated: Brandey D Okray Thomas Staniszewski  Feedback Note: I met with Joanne Brock to review the findings resulting from her neuropsychological evaluation. Since the last appointment, she has been about the same. Time was spent reviewing the impressions and recommendations that are detailed in the evaluation report. We had a long conversation about her diagnosis, the fact that pathologic diagnosis cannot be done with certainty in life, but that her clinical presentation is highly consistent with expectations in the setting of Alzheimer's clinical syndrome. She has some atypical parietal signs and symptoms although those are also often times caused by Alzheimer's. I explained that staging is, to some extent, a subjective activity but that very mild dementia is likely the best characterization of her current status considering all evidence. There will likely be progression over time and we reviewed the MIND diet and other lifestyle changes that may be of benefit. I took time to explain the findings and answer all the patient's questions. I encouraged Joanne Brock to contact me should she have any further questions or if further follow up is desired.   Current Medications and Medical History   Current Outpatient Medications  Medication Sig Dispense Refill  . Multiple Vitamin (MULTIVITAMIN) capsule Take 1 capsule by mouth daily.    . rizatriptan (MAXALT) 10 MG tablet Take 10 mg by mouth as needed for migraine. May repeat in 2 hours if needed     No current  facility-administered medications for this visit.    Patient Active Problem List   Diagnosis Date Noted  . Morton neuroma, left 10/02/2017  . Osteopenia 04/16/2017  . Vitamin D deficiency 02/20/2017  . History of adenomatous polyp of colon 02/20/2017  . Hyperlipidemia 02/08/2017  . History of skin cancer   . Migraines     Mental Status and Behavioral Observations  Joanne Brock was available at the prespecified time for this telephonic appointment and was alert and generally oriented (orientation not formally assessed). Speech was normal in rate, rhythm, volume, and prosody. Self-reported mood was "good" and affect as assessed by vocal quality was mainly euthymic. Thought process was logical and goal oriented and thought content was pertinent. There were no safety concerns identified at today's encounter, such as thoughts of harming self or others.   Plan  Feedback provided regarding the patient's neuropsychological evaluation. We discussed her diagnosis, she is aware this is likely Alzheimer's and that there will probably be progression over time. She and her husband are motivated to do what they can to delay that progression and were instructed in the MIND diet, exercise, and importance of keeping cognitive active. I also referred the to Alzheimer's Association for further education and community programming. She is interested in starting medication, stated a strong preference to follow with Dr. Aquino for her dementia, and I will loop her in. Joanne Brock was encouraged to contact me if any questions arise or if further follow up is desired.    V. , PsyD, ABN Clinical Neuropsychologist  Service(s) Provided at This Encounter: 51 minutes ([id number]; Conjoint therapy with patient present)   

## 2020-03-04 NOTE — Patient Instructions (Signed)
Your performance and presentation on assessment was consistent with difficulties in several cognitive domains, including on measures of memory, executive functioning, processing speed, with mental calculation, and you also had some additional difficulties on non-cognitive testing including apraxia (difficulties doing skilled motor movements, such as pantomiming how to use tools). You and your husband reported that you have continued to function very well with things, with the exception of him needing to take over the finances. Considering all available information, I think that very mild dementia is the best term for your current level of difficulty.   Dementia refers to a group of syndromes where multiple areas of ability are damaged in the brain, such as memory, thinking, judgment, and behavior, and most commonly refers to age related causes of dementia that cause worsening in these abilities over time. Alzheimer's disease is the most common form of dementia in people over the age of 8. Not all dementias are Alzheimer's disease, but all Alzheimer's disease is dementia. When dementia is due to an underlying condition affecting the brain, such as Alzheimer's disease, there is progression over time, which typically procedes gradually over many years.   In your case, I think that your dementia is likely due to the most common cause of age-related cognitive decline, namely Alzheimer's disease. Alzheimer's disease is most often associated with an amnestic dementia syndrome although it can present in many different ways. Many of these presentations involve so-called "parietal" signs and symptoms, including apraxia, prominent difficulties with calculation, and with visual scanning. You have some of these atypical features but do not meet criteria for any of the other dementia syndromes and thus, I think that your dementia is due to Alzheimer's disease while acknowledging that it has some atypical features.  Sometimes when dementia happens earlier, it presents with more atypical features, which may be the case for you.   You could consider discussing an antidementia medication with your PCP Dr. Yong Channel. This is a medication for memory and other thinking abilities. These medications are typically of limited benefit, they are not life changing, and they do not alter or stop the disease process but they can result in some mild symptomatic improvement.   I think it is appropriate that your husband is managing the finances and suggest that he continues to do so. I would also suggest that you get help with any other activities you are having difficulties with. You reported that you do not have difficulties with things such as managing your medications, using computers, or reading, although I suggest that your husband pay close attention to better assess that and provide help as needed.   There is now good quality evidence from at least one large scale study that a modified mediterranean diet may help slow cognitive decline. This is known as the "MIND" diet. The Mind diet is not so much a specific diet as it is a set of recommendations for things that you should and should not eat.   Foods that are ENCOURAGED on the MIND Diet:  Green, leafy vegetables: Aim for six or more servings per week. This includes kale, spinach, cooked greens and salads.  All other vegetables: Try to eat another vegetable in addition to the green leafy vegetables at least once a day. It is best to choose non-starchy vegetables because they have a lot of nutrients with a low number of calories.  Berries: Eat berries at least twice a week. There is a plethora of research on strawberries, and other berries such as blueberries,  raspberries and blackberries have also been found to have antioxidant and brain health benefits.  Nuts: Try to get five servings of nuts or more each week. The creators of the [employer] don't specify what kind of nuts to  consume, but it is probably best to vary the type of nuts you eat to obtain a variety of nutrients. Peanuts are a legume and do not fall into this category.  Olive oil: Use olive oil as your main cooking oil. There may be other heart-healthy alternatives such as algae oil, though there is not yet sufficient research upon which to base a formal recommendation.  Whole grains: Aim for at least three servings daily. Choose minimally processed grains like oatmeal, quinoa, brown rice, whole-wheat pasta and 100% whole-wheat bread.  Fish: Eat fish at least once a week. It is best to choose fatty fish like salmon, sardines, trout, tuna and mackerel for their high amounts of omega-3 fatty acids.  Beans: Include beans in at least four meals every week. This includes all beans, lentils and soybeans.  Poultry: Try to eat chicken or [place] at least twice a week. Note that fried chicken is not encouraged on the MIND diet.  Wine: Aim for no more than one glass of alcohol daily. Both red and white wine may benefit the brain. However, much research has focused on the red wine compound resveratrol, which may help protect against Alzheimer's disease.  Foods that are DISCOURAGED on the MIND Diet: Butter and margarine: Try to eat less than 1 tablespoon (about 14 grams) daily. Instead, try using olive oil as your primary cooking fat, and dipping your bread in olive oil with herbs.  Cheese: The MIND diet recommends limiting your cheese consumption to less than once per week.  Red meat: Aim for no more than three servings each week. This includes all beef, pork, lamb and products made from these meats.  [place] food: The MIND diet highly discourages fried food, especially the kind from fast-food restaurants. Limit your consumption to less than once per week.  Pastries and sweets: This includes most of the processed junk food and desserts you can think of. Ice cream, cookies, brownies, snack cakes, donuts, candy and more. Try to  limit these to no more than four times a week.  Exercise is one of the best medicines for promoting health and maintaining cognitive fitness at all stages in life. Exercise probably has the largest documented effect on brain health and performance of any lifestyle intervention. Studies have shown that even previously sedentary individuals who start exercising as late as age 67 show a significant survival benefit as compared to their non-exercising peers. In the [place], the current guidelines are for 30 minutes of moderate exercise per day, but increasing your activity level less than that may also be helpful. You do not have to get your 30 minutes of exercise in one shot and exercising for short periods of time spread throughout the day can be helpful. Go for several walks, learn to dance, or do something else you enjoy that gets your body moving. Of course, if you have an underlying medical condition or there is any question about whether it is safe for you to exercise, you should consult a medical treatment provider prior to beginning exercise.   As with all capable adults, I would recommend that you consult with an elder care attorney regarding advanced directives if you have not, such as a healthcare power of attorney and living will. Consultation with  an elder care attorney can help you protect your estate and communicate your preferences to your loved ones formally, in the event you are not able to do so yourself. I can provide my strong recommendation for the [employer], who are located at [address] in [place], [place]. They can be reached at [phone]. They also have a website [email or web address].

## 2020-03-09 ENCOUNTER — Ambulatory Visit (INDEPENDENT_AMBULATORY_CARE_PROVIDER_SITE_OTHER): Payer: Medicare Other | Admitting: Neurology

## 2020-03-09 ENCOUNTER — Encounter: Payer: Self-pay | Admitting: Neurology

## 2020-03-09 ENCOUNTER — Other Ambulatory Visit: Payer: Self-pay

## 2020-03-09 VITALS — BP 100/58 | HR 82 | Ht 63.0 in | Wt 114.8 lb

## 2020-03-09 DIAGNOSIS — G301 Alzheimer's disease with late onset: Secondary | ICD-10-CM | POA: Diagnosis not present

## 2020-03-09 DIAGNOSIS — F0280 Dementia in other diseases classified elsewhere without behavioral disturbance: Secondary | ICD-10-CM | POA: Diagnosis not present

## 2020-03-09 MED ORDER — DONEPEZIL HCL 10 MG PO TABS
ORAL_TABLET | ORAL | 11 refills | Status: DC
Start: 2020-03-09 — End: 2020-08-18

## 2020-03-09 NOTE — Progress Notes (Signed)
NEUROLOGY FOLLOW UP OFFICE NOTE  Joanne Brock [id number] 1951/02/13  HISTORY OF PRESENT ILLNESS: I had the pleasure of seeing Joanne Brock in follow-up in the neurology clinic on 03/09/2020.  The patient was last seen a month ago for cognitive changed. She is again accompanied by her husband who helps supplement the history today.  Records and images were personally reviewed where available. They present today to discuss results and recommendations from recent Neuropsychological evaluation done 02/2020 where she was diagnosed with Alzheimer's dementia without behavioral disturbance, MCI level of function. There was significant cognitive impairment including memory storage problems, deficient visual object confrontation naming, and extremely low scores on measures of processing speed and executive function. She was noted to perform relatively well on visual spatial tasks despite having parietal signs and symptoms of dyscalculia, apraxia (L>R) and some borderline finger agnosia and dysgraphesthesia.    History on Initial Assessment 01/27/2020: This is a pleasant 69 year old right-handed woman with a history of skin cancer, migraines, nephrolithiasis, presenting for evaluation of memory loss and worsening migraines. She feels like memory issues do not affect her on a regular basis, and started noticing changes a few months ago. Her husband started noticing changes towards the last quarter of 2019. She is a retired Engineer, maintenance (IT) and would be able to do simple math in her head, but started using the calculator more. She started noticing that calculations she could do were not coming easily, now taking her a longer time. She used to do the books in 45 minutes, and started taking 6 hours to complete. Her husband took over 3-4 months ago. She was not missing any payments. She denies getting lost driving, leaving the stove on, misplacing things. She is not on any regular medications, she takes prn Maxalt for migraine  rescue. Her husband feels changes have been overall stable since 2019 but noticed she is working around them more. Sometimes she asks the same question, especially with time, repeatedly asking when it is time for them to leave. She has occasional word-finding issues. Her mother had memory loss consistent with dementia. Her father had a dementing illness and drug-induced Parkinson's disease but did not have memory issues. No history of significant head injuries or alcohol use.  She started having migraines in her 37s, with pressure over the right temporal region majority of the time. Occasionally she has left temporal pain. No associated nausea/vomiting, photo/phonophobia, visual obscurations. She takes prn Maxalt with good effect. She was having migraines more frequently last May, but this has quieted down, she does not need to take Maxalt every month. She denies any dizziness, diplopia, dysarthria/dysphagia, neck/back pain, focal numbness/tingling/weakness, bowel/bladder dysfunction, anosmia, or tremors. Mood is good. No paranoia or hallucinations, no personality changes. Sleep is good. She retired in January 2018.  I personally reviewed MRI brain without contrast done 11/2019 which did not show any acute changes. There was mild diffuse atrophy and a few punctate foci of T2 hyperintensity, within normal limits for age.   Laboratory Data: Lab Results  Component Value Date   TSH 2.04 10/21/2019   Lab Results  Component Value Date   IWOEHOZY24 825 10/21/2019     PAST MEDICAL HISTORY: Past Medical History:  Diagnosis Date   Allergy    Cancer ([place])    basal cell and squamous cell skin cancer   History of skin cancer    squamous and basal x 8 in 2018. Dr. Renda Rolls. Had been with Dr. Tonia Brooms.    Kidney stone  x1   Migraines    Aura- smell, spots. once a month- sumatriptan. topamax 50mg  once a day prophylaxis.    Osteopenia     MEDICATIONS: Current Outpatient Medications on File  Prior to Visit  Medication Sig Dispense Refill   Multiple Vitamin (MULTIVITAMIN) capsule Take 1 capsule by mouth daily.     rizatriptan (MAXALT) 10 MG tablet Take 10 mg by mouth as needed for migraine. May repeat in 2 hours if needed     No current facility-administered medications on file prior to visit.    ALLERGIES: No Known Allergies  FAMILY HISTORY: Family History  Problem Relation Age of Onset   Hypertension Mother    Stroke Mother        TIAs- declined over years and stroke eventually. died 34   Other Father        decline after ? heat stroke. died around 87   Stroke Sister        30. nonsmoker.    Ulcerative colitis Daughter    Colon cancer Neg Hx    Colon polyps Neg Hx    Esophageal cancer Neg Hx    Stomach cancer Neg Hx    Rectal cancer Neg Hx     SOCIAL HISTORY: Social History   Socioeconomic History   Marital status: Married    Spouse name: Not on file   Number of children: Not on file   Years of education: Not on file   Highest education level: Not on file  Occupational History   Not on file  Tobacco Use   Smoking status: Never Smoker   Smokeless tobacco: Never Used  Vaping Use   Vaping Use: Never used  Substance and Sexual Activity   Alcohol use: No    Alcohol/week: 0.0 standard drinks   Drug use: No   Sexual activity: Yes  Other Topics Concern   Not on file  Social History Narrative   Married over 40 years in 2018. 1 daughter 6 years old in 2018. No grandkids- 2 grandpets.    Had to spend great deal of time caring for aging parents- now passed.       Undergrad at [employer].    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 [place]: travel   Right Handed   One Story Home   Does drink caffeine occasionally   Social Determinants of Health   Financial Resource Strain:    Difficulty of Paying Living Expenses: Not on file  Food Insecurity:    Worried About Charity fundraiser in the Last Year: Not on  file   [employer] of Food in the Last Year: Not on file  Transportation Needs:    Lack of Transportation (Medical): Not on file   Lack of Transportation (Non-Medical): Not on file  Physical Activity:    Days of Exercise per Week: Not on file   Minutes of Exercise per Session: Not on file  Stress:    Feeling of Stress : Not on file  Social Connections:    Frequency of Communication with Friends and Family: Not on file   Frequency of Social Gatherings with Friends and Family: Not on file   Attends Religious Services: Not on file   Active Member of Clubs or Organizations: Not on file   Attends Archivist Meetings: Not on file   Marital Status: Not on file  Intimate Partner Violence:    Fear of Current or Ex-Partner: Not  on file   Emotionally Abused: Not on file   Physically Abused: Not on file   Sexually Abused: Not on file     PHYSICAL EXAM: Vitals:   03/09/20 0829  BP: (!) 100/58  Pulse: 82  SpO2: 96%   General: No acute distress Head:  Normocephalic/atraumatic Skin/Extremities: No rash, no edema Neurological Exam: awake and alert. Attention and concentration are normal.   Cranial nerves: Pupils equal, round. Extraocular movements intact  No facial asymmetry.  Motor: moves all extremities symmetrically. Gait narrow-based and steady.   IMPRESSION: This is a pleasant 69 yo RH woman with a history of skin cancer, migraines, nephrolithiasis, migraines, with recent Neuropsychological evaluation indicating Alzheimer's dementia without behavioral disturbance, MCI level of functioning. Diagnosis, prognosis, and management discussed with patient and husband today. We discussed starting Donepezil 10mg  1/2 tablet daily for 2 weeks, then increase to 1 tablet daily. Side effect and expectations from medication were discussed. We discussed the importance of control of vascular risk factors, physical exercise and brain stimulation exercises for brain health. They are  potentially interested in joining clinical trials, information for [facility] provided today. Continue close supervision, follow-up in 6 months. They know to call for any changes.   Thank you for allowing me to participate in her care.  Please do not hesitate to call for any questions or concerns.   Ellouise Newer, M.D.   CC: Dr. Yong Channel

## 2020-03-09 NOTE — Patient Instructions (Signed)
Good to see you.  1. Start Aricept (Donepezil) 10mg : take 1/2 tablet daily for 2 weeks, then increase to 1 tablet daily  2. There are some activities which have therapeutic value and can be useful in keeping you cognitively stimulated. You can try this website: https://www.barrowneuro.org/get-to-know-barrow/centers-programs/neurorehabilitation-center/neuro-rehab-apps-and-games/ which has options, categorized by level of difficulty.   3. This is the website for [facility] clinical trials: https://school.[employer].nl Tel. No. (531)415-4062  4. Follow-up in 6 months, call for any changes   FALL PRECAUTIONS: Be cautious when walking. Scan the area for obstacles that may increase the risk of trips and falls. When getting up in the mornings, sit up at the edge of the bed for a few minutes before getting out of bed. Consider elevating the bed at the head end to avoid drop of blood pressure when getting up. Walk always in a well-lit room (use night lights in the walls). Avoid area rugs or power cords from appliances in the middle of the walkways. Use a walker or a cane if necessary and consider physical therapy for balance exercise. Get your eyesight checked regularly.  FINANCIAL OVERSIGHT: Supervision, especially oversight when making financial decisions or transactions is also recommended as difficulties arise.  HOME SAFETY: Consider the safety of the kitchen when operating appliances like stoves, microwave oven, and blender. Consider having supervision and share cooking responsibilities until no longer able to participate in those. Accidents with firearms and other hazards in the house should be identified and addressed as well.  DRIVING: Regarding driving, in patients with progressive memory problems, driving will be impaired. We advise to have someone else do the driving if trouble finding directions or  if minor accidents are reported. Independent driving assessment is available to determine safety of driving.  ABILITY TO BE LEFT ALONE: If patient is unable to contact 911 operator, consider using LifeLine, or when the need is there, arrange for someone to stay with patients. Smoking is a fire hazard, consider supervision or cessation. Risk of wandering should be assessed by caregiver and if detected at any point, supervision and safe proof recommendations should be instituted.  MEDICATION SUPERVISION: Inability to self-administer medication needs to be constantly addressed. Implement a mechanism to ensure safe administration of the medications.  RECOMMENDATIONS FOR ALL PATIENTS WITH MEMORY PROBLEMS: 1. Continue to exercise (Recommend 30 minutes of walking everyday, or 3 hours every week) 2. Increase social interactions - continue going to [place] and enjoy social gatherings with friends and family 3. Eat healthy, avoid fried foods and eat more fruits and vegetables 4. Maintain adequate blood pressure, blood sugar, and blood cholesterol level. Reducing the risk of stroke and cardiovascular disease also helps promoting better memory. 5. Avoid stressful situations. Live a simple life and avoid aggravations. Organize your time and prepare for the next day in anticipation. 6. Sleep well, avoid any interruptions of sleep and avoid any distractions in the bedroom that may interfere with adequate sleep quality 7. Avoid sugar, avoid sweets as there is a strong link between excessive sugar intake, diabetes, and cognitive impairment We discussed the Mediterranean diet, which has been shown to help patients reduce the risk of progressive memory disorders and reduces cardiovascular risk. This includes eating fish, eat fruits and green leafy vegetables, nuts like almonds and hazelnuts, walnuts, and also use olive oil. Avoid fast foods and fried foods as much as possible. Avoid sweets and sugar as sugar use has been  linked to worsening of memory function.  There is always a concern of  gradual progression of memory problems. If this is the case, then we may need to adjust level of care according to patient needs. Support, both to the patient and caregiver, should then be put into place.

## 2020-03-15 ENCOUNTER — Encounter: Payer: Self-pay | Admitting: Family Medicine

## 2020-03-15 DIAGNOSIS — Z23 Encounter for immunization: Secondary | ICD-10-CM | POA: Diagnosis not present

## 2020-04-15 DIAGNOSIS — Z23 Encounter for immunization: Secondary | ICD-10-CM | POA: Diagnosis not present

## 2020-04-21 NOTE — Progress Notes (Signed)
Phone [phone]   Subjective:  Patient presents today for their annual physical. Chief complaint-noted.   See problem oriented charting- ROS- full  review of systems was completed and negative except for: lower appetite/nausea with some weight loss on aricept  The following were reviewed and entered/updated in epic: Past Medical History:  Diagnosis Date  . Allergy   . Cancer ([place])    basal cell and squamous cell skin cancer  . History of skin cancer    squamous and basal x 8 in 2018. Dr. Renda Rolls. Had been with Dr. Tonia Brooms.   . Kidney stone    x1  . Migraines    Aura- smell, spots. once a month- sumatriptan. topamax 62m once a day prophylaxis.   . Osteopenia    Patient Active Problem List   Diagnosis Date Noted  . Late onset Alzheimer's disease without behavioral disturbance ([facility] 03/04/2020    Priority: High  . Osteopenia 04/16/2017    Priority: Medium  . History of adenomatous polyp of colon 02/20/2017    Priority: Medium  . Hyperlipidemia 02/08/2017    Priority: Medium  . Migraines     Priority: Medium  . Morton neuroma, left 10/02/2017    Priority: Low  . Vitamin D deficiency 02/20/2017    Priority: Low  . History of skin cancer     Priority: Low   Past Surgical History:  Procedure Laterality Date  . COLONOSCOPY    . TONSILLECTOMY Bilateral 1960    Family History  Problem Relation Age of Onset  . Hypertension Mother   . Stroke Mother        TIAs- declined over years and stroke eventually. died 853 . Other Father        decline after ? heat stroke. died around 892 . Stroke Sister        619 nonsmoker.   .Marland KitchenUlcerative colitis Daughter   . Colon cancer Neg Hx   . Colon polyps Neg Hx   . Esophageal cancer Neg Hx   . Stomach cancer Neg Hx   . Rectal cancer Neg Hx     Medications- reviewed and updated Current Outpatient Medications  Medication Sig Dispense Refill  . donepezil (ARICEPT) 10 MG tablet Take 1/2 tablet daily for 2 weeks, then  increase to 1 tablet daily (Patient taking differently: Take 5 mg by mouth at bedtime. ) 30 tablet 11  . Multiple Vitamin (MULTIVITAMIN) capsule Take 1 capsule by mouth daily.    . rizatriptan (MAXALT) 10 MG tablet Take 10 mg by mouth as needed for migraine. May repeat in 2 hours if needed     No current facility-administered medications for this visit.    Allergies-reviewed and updated No Known Allergies  Social History   Social History Narrative   Married over 40 years in 2018. 1 daughter 210years old in 2018. No grandkids- 2 grandpets.    Had to spend great deal of time caring for aging parents- now passed.       Undergrad at [employer]    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 [employer]   Right Handed   One Story Home   Does drink caffeine occasionally   Objective  Objective:  BP 108/64   Pulse 62   Temp 98.1 F (36.7 C) (Temporal)   Resp 18   Ht _0  (1.6 m)   Wt 109 lb 3.2 oz (49.5 kg)   SpO2 98%  BMI 19.34 kg/m  Gen: NAD, resting comfortably HEENT: Mucous membranes are moist. Oropharynx normal Neck: no thyromegaly CV: RRR no murmurs rubs or gallops Lungs: CTAB no crackles, wheeze, rhonchi Abdomen: soft/nontender/nondistended/normal bowel sounds. No rebound or guarding.  Ext: no edema Skin: warm, dry Neuro: grossly normal, moves all extremities, PERRLA   Assessment and Plan   69 y.o. female presenting for annual health review (not CPE)-annual wellness visit scheduled next week Health Maintenance counseling: 1. Anticipatory guidance: Patient counseled regarding regular dental exams q6 months, eye exams- yearly,  avoiding smoking and second hand smoke, limiting alcohol to 1 beverage per day- not drinking.   2. Risk factor reduction:  Advised patient of need for regular exercise and diet rich and fruits and vegetables to reduce risk of heart attack and stroke. Exercise- at least 2 miles a day. Diet-advised avoid weight loss.  Contributed to by nausea on aricept/donepazil  Wt Readings from Last 3 Encounters:  04/23/20 109 lb 3.2 oz (49.5 kg)  03/09/20 114 lb 12.8 oz (52.1 kg)  01/27/20 114 lb (51.7 kg)  3. Immunizations/screenings/ancillary studies- November 4th covid booster and already had flu shot Immunization History  Administered Date(s) Administered  . Fluad Quad(high Dose 65+) 03/15/2020  . Hepatitis A 11/05/2009  . Influenza, High Dose Seasonal PF 03/19/2018, 03/24/2019  . Influenza,inj,Quad PF,6+ Mos 04/03/2017  . Influenza-Unspecified 03/03/2009, 03/09/2010, 03/14/2011, 03/19/2012, 03/31/2013, 03/27/2014, 04/02/2015, 04/11/2016  . MMR 11/12/2017  . PFIZER SARS-COV-2 Vaccination 07/01/2019, 07/21/2019  . Pneumococcal Conjugate-13 11/12/2017  . Pneumococcal Polysaccharide-23 04/23/2019  . Tdap 06/12/2012  . Zoster 03/20/2013  . Zoster Recombinat (Shingrix) 04/29/2019, 09/30/2019   4. Cervical cancer screening- decides on this with dr. Marvel Plan 5. Breast cancer screening-   mammogram - will plan on this with Dr. Marvel Plan 6. Colon cancer screening - 05/2018 with possible 10 year repeat 7. Skin cancer screening- sees dermatology yearly. advised regular sunscreen use. Denies worrisome, changing, or new skin lesions.  8. Birth control/STD check- postmenopausal/monogmous 9. Osteoporosis screening at 45- see below -Never smoker  Status of chronic or acute concerns   # Alzheimers dementia S:takes donepazil in evening. Nausea issues on higher dose . Went back to 5 mg dose but still having some nausea.  A/P: message to neurologist Dr. Delice Lesch "Hey Dr. Delice Lesch,  Patient with nausea still on aricept 5 mg after reducing down from 10 mg. Originally she did well on 5 mg before the increase to 69m. I was going to have her try off for 2 weeks and if symptoms improve- retrial at 5 mg. I wanted to make sure this was ok with you as well.   Thanks for caring for her! SGarret Reddish  If 5 mg works- will  continue until march visit. If does not work- will follow up with Dr. ADelice Leschsooner.   Also looked back at last b12 - would prefer for her MV to have b12 as low normal at 328 last check  # Migraines S: took 3 maxalt last week but mostly average 2 per week with reasonable Control/good relief A/P: good control- continue current meds   #hyperlipidemia S: Medication:none  Lab Results  Component Value Date   CHOL 247 (H) 04/23/2019   HDL 69.00 04/23/2019   LDLCALC 160 (H) 04/23/2019   TRIG 91.0 04/23/2019   CHOLHDL 4 04/23/2019   A/P: 10-year ASCVD risk only 5.1%-in addition with memory changes and no stroke history would prefer to stay off statin if possible  #Vitamin D deficiency S: Medication:  vitamin  D in MV Last vitamin D Lab Results  Component Value Date   VD25OH 41.09 04/23/2019  A/P: We discussed at least getting 800 units of vitamin D per day-patient is going to check multivitamin at home with help of her husband  # Low Bone density (formerly osteopenia) S: Last DEXA: November 12 2019- reasonably stable with Dr. Marvel Plan  Calcium: 1276m (through diet ok) recommended - gets some through MV Vitamin D: 800 units a day recommended  Walking for weight bearing exercise 2 miles a day  A/P: Reasonably stable on last check-discussed goals for calcium and vitamin D and continuing weightbearing exercise.  Recommended follow up: Return in about 1 year (around 04/23/2021) for follow up- or sooner if needed. Future Appointments  Date Time Provider [place] 04/26/2020 10:15 AM [facility] HEALTH COACH [facility]  09/07/2020 11:30 AM ACameron Sprang MD [facility] None   Lab/Order associations:non fasting   ICD-10-CM   1. Hyperlipidemia, unspecified hyperlipidemia type  E78.5 Lipid Panel w/reflex Direct LDL    Lipid Panel w/reflex Direct LDL  2. Vitamin D deficiency  E55.9 VITAMIN D 25 Hydroxy (Vit-D Deficiency, Fractures)    VITAMIN D 25 Hydroxy (Vit-D Deficiency, Fractures)   3. Migraine with aura and without status migrainosus, not intractable  G43.109   4. Osteopenia of neck of right femur  M85.851   5. Late onset Alzheimer's disease without behavioral disturbance (HCC)  G30.1    F02.80    Return precautions advised.  SGarret Reddish MD

## 2020-04-23 ENCOUNTER — Ambulatory Visit (INDEPENDENT_AMBULATORY_CARE_PROVIDER_SITE_OTHER): Payer: Medicare Other | Admitting: Family Medicine

## 2020-04-23 ENCOUNTER — Encounter: Payer: Self-pay | Admitting: Family Medicine

## 2020-04-23 ENCOUNTER — Other Ambulatory Visit: Payer: Self-pay

## 2020-04-23 ENCOUNTER — Ambulatory Visit: Payer: Medicare Other

## 2020-04-23 VITALS — BP 108/64 | HR 62 | Temp 98.1°F | Resp 18 | Ht 63.0 in | Wt 109.2 lb

## 2020-04-23 DIAGNOSIS — F0280 Dementia in other diseases classified elsewhere without behavioral disturbance: Secondary | ICD-10-CM | POA: Diagnosis not present

## 2020-04-23 DIAGNOSIS — G43109 Migraine with aura, not intractable, without status migrainosus: Secondary | ICD-10-CM

## 2020-04-23 DIAGNOSIS — M85851 Other specified disorders of bone density and structure, right thigh: Secondary | ICD-10-CM

## 2020-04-23 DIAGNOSIS — E559 Vitamin D deficiency, unspecified: Secondary | ICD-10-CM

## 2020-04-23 DIAGNOSIS — G301 Alzheimer's disease with late onset: Secondary | ICD-10-CM

## 2020-04-23 DIAGNOSIS — E785 Hyperlipidemia, unspecified: Secondary | ICD-10-CM

## 2020-04-23 DIAGNOSIS — Z0000 Encounter for general adult medical examination without abnormal findings: Secondary | ICD-10-CM

## 2020-04-23 NOTE — Patient Instructions (Addendum)
Health Maintenance Due  Topic Date Due  . MAMMOGRAM - have them send [place] a copy after completed 03/13/2020   Recommend minimum of 800 units vitamin D per day - 1000 is fine. For calcium want 1200mg  per day  See calcium handout  Please stop by lab before you go If you have mychart- we will send your results within 3 business days of [place] receiving them.  If you do not have mychart- we will call you about results within 5 business days of [place] receiving them.  *please note we are currently using [facility] which has a longer processing time than [facility] typically so labs may not come back as quickly as in the past *please also note that you will see labs on mychart as soon as they post. I will later go in and write notes on them- will say "notes from Dr. Yong Channel"  Recommended follow up: Return in about 1 year (around 04/23/2021) for follow up- or sooner if needed.

## 2020-04-24 LAB — LIPID PANEL W/REFLEX DIRECT LDL
Cholesterol: 238 mg/dL — ABNORMAL HIGH (ref <200)
HDL: 66 mg/dL (ref >=50)
LDL Cholesterol (Calc): 151 mg/dL (calc) — ABNORMAL HIGH
Non-HDL Cholesterol (Calc): 172 mg/dL (calc) — ABNORMAL HIGH (ref <130)
Total CHOL/HDL Ratio: 3.6 (calc) (ref <5.0)
Triglycerides: 97 mg/dL (ref <150)

## 2020-04-24 LAB — VITAMIN D 25 HYDROXY (VIT D DEFICIENCY, FRACTURES): Vit D, 25-Hydroxy: 26 ng/mL — ABNORMAL LOW (ref 30–100)

## 2020-04-25 ENCOUNTER — Other Ambulatory Visit: Payer: Self-pay | Admitting: Family Medicine

## 2020-04-25 MED ORDER — VITAMIN D (ERGOCALCIFEROL) 1.25 MG (50000 UNIT) PO CAPS: 50000.0000 [IU] | ORAL_CAPSULE | ORAL | 0 refills | Status: DC

## 2020-04-26 ENCOUNTER — Ambulatory Visit (INDEPENDENT_AMBULATORY_CARE_PROVIDER_SITE_OTHER): Payer: Medicare Other

## 2020-04-26 DIAGNOSIS — Z0000 Encounter for general adult medical examination without abnormal findings: Secondary | ICD-10-CM

## 2020-04-26 NOTE — Patient Instructions (Addendum)
Joanne Brock , Thank you for taking time to come for your Medicare Wellness Visit. I appreciate your ongoing commitment to your health goals. Please review the following plan we discussed and let me know if I can assist you in the future.   Screening recommendations/referrals: Colonoscopy: Done 05/22/18 Mammogram: Done 03/14/19 Bone Density: Done 11/12/19 Recommended yearly ophthalmology/optometry visit for glaucoma screening and checkup Recommended yearly dental visit for hygiene and checkup  Vaccinations: Influenza vaccine: Up to date Done 03/15/20 Pneumococcal vaccine: Up to date Tdap vaccine: Up to date Shingles vaccine: Completed 04/29/19 & 09/30/19   Covid-19:Completed 1/19, 2/8, & 04/15/20  Advanced directives: Please bring a copy of your health care power of attorney and living will to the office at your convenience.   Conditions/risks identified: None at this time  Next appointment: Follow up in one year for your annual wellness visit    Preventive Care 65 Years and Older, Female Preventive care refers to lifestyle choices and visits with your health care provider that can promote health and wellness. What does preventive care include?  A yearly physical exam. This is also called an annual well check.  Dental exams once or twice a year.  Routine eye exams. Ask your health care provider how often you should have your eyes checked.  Personal lifestyle choices, including:  Daily care of your teeth and gums.  Regular physical activity.  Eating a healthy diet.  Avoiding tobacco and drug use.  Limiting alcohol use.  Practicing safe sex.  Taking low-dose aspirin every day.  Taking vitamin and mineral supplements as recommended by your health care provider. What happens during an annual well check? The services and screenings done by your health care provider during your annual well check will depend on your age, overall health, lifestyle risk factors, and family  history of disease. Counseling  Your health care provider may ask you questions about your:  Alcohol use.  Tobacco use.  Drug use.  Emotional well-being.  Home and relationship well-being.  Sexual activity.  Eating habits.  History of falls.  Memory and ability to understand (cognition).  Work and work Statistician.  Reproductive health. Screening  You may have the following tests or measurements:  Height, weight, and BMI.  Blood pressure.  Lipid and cholesterol levels. These may be checked every 5 years, or more frequently if you are over 3 years old.  Skin check.  Lung cancer screening. You may have this screening every year starting at age 16 if you have a 30-pack-year history of smoking and currently smoke or have quit within the past 15 years.  Fecal occult blood test (FOBT) of the stool. You may have this test every year starting at age 73.  Flexible sigmoidoscopy or colonoscopy. You may have a sigmoidoscopy every 5 years or a colonoscopy every 10 years starting at age 70.  Hepatitis C blood test.  Hepatitis B blood test.  Sexually transmitted disease (STD) testing.  Diabetes screening. This is done by checking your blood sugar (glucose) after you have not eaten for a while (fasting). You may have this done every 1-3 years.  Bone density scan. This is done to screen for osteoporosis. You may have this done starting at age 72.  Mammogram. This may be done every 1-2 years. Talk to your health care provider about how often you should have regular mammograms. Talk with your health care provider about your test results, treatment options, and if necessary, the need for more tests. Vaccines  Your  health care provider may recommend certain vaccines, such as:  Influenza vaccine. This is recommended every year.  Tetanus, diphtheria, and acellular pertussis (Tdap, Td) vaccine. You may need a Td booster every 10 years.  Zoster vaccine. You may need this after  age 70.  Pneumococcal 13-valent conjugate (PCV13) vaccine. One dose is recommended after age 30.  Pneumococcal polysaccharide (PPSV23) vaccine. One dose is recommended after age 16. Talk to your health care provider about which screenings and vaccines you need and how often you need them. This information is not intended to replace advice given to you by your health care provider. Make sure you discuss any questions you have with your health care provider. Document Released: 06/25/2015 Document Revised: 02/16/2016 Document Reviewed: 03/30/2015 Elsevier Interactive Patient Education  2017 [place] Prevention in the Home Falls can cause injuries. They can happen to people of all ages. There are many things you can do to make your home safe and to help prevent falls. What can I do on the outside of my home?  Regularly fix the edges of walkways and driveways and fix any cracks.  Remove anything that might make you trip as you walk through a door, such as a raised step or threshold.  Trim any bushes or trees on the path to your home.  Use bright outdoor lighting.  Clear any walking paths of anything that might make someone trip, such as rocks or tools.  Regularly check to see if handrails are loose or broken. Make sure that both sides of any steps have handrails.  Any raised decks and porches should have guardrails on the edges.  Have any leaves, snow, or ice cleared regularly.  Use sand or salt on walking paths during winter.  Clean up any spills in your garage right away. This includes oil or grease spills. What can I do in the bathroom?  Use night lights.  Install grab bars by the toilet and in the tub and shower. Do not use towel bars as grab bars.  Use non-skid mats or decals in the tub or shower.  If you need to sit down in the shower, use a plastic, non-slip stool.  Keep the floor dry. Clean up any water that spills on the floor as soon as it  happens.  Remove soap buildup in the tub or shower regularly.  Attach bath mats securely with double-sided non-slip rug tape.  Do not have throw rugs and other things on the floor that can make you trip. What can I do in the bedroom?  Use night lights.  Make sure that you have a light by your bed that is easy to reach.  Do not use any sheets or blankets that are too big for your bed. They should not hang down onto the floor.  Have a firm chair that has side arms. You can use this for support while you get dressed.  Do not have throw rugs and other things on the floor that can make you trip. What can I do in the kitchen?  Clean up any spills right away.  Avoid walking on wet floors.  Keep items that you use a lot in easy-to-reach places.  If you need to reach something above you, use a strong step stool that has a grab bar.  Keep electrical cords out of the way.  Do not use floor polish or wax that makes floors slippery. If you must use wax, use non-skid floor wax.  Do not  have throw rugs and other things on the floor that can make you trip. What can I do with my stairs?  Do not leave any items on the stairs.  Make sure that there are handrails on both sides of the stairs and use them. Fix handrails that are broken or loose. Make sure that handrails are as long as the stairways.  Check any carpeting to make sure that it is firmly attached to the stairs. Fix any carpet that is loose or worn.  Avoid having throw rugs at the top or bottom of the stairs. If you do have throw rugs, attach them to the floor with carpet tape.  Make sure that you have a light switch at the top of the stairs and the bottom of the stairs. If you do not have them, ask someone to add them for you. What else can I do to help prevent falls?  Wear shoes that:  Do not have high heels.  Have rubber bottoms.  Are comfortable and fit you well.  Are closed at the toe. Do not wear sandals.  If you  use a stepladder:  Make sure that it is fully opened. Do not climb a closed stepladder.  Make sure that both sides of the stepladder are locked into place.  Ask someone to hold it for you, if possible.  Clearly mark and make sure that you can see:  Any grab bars or handrails.  First and last steps.  Where the edge of each step is.  Use tools that help you move around (mobility aids) if they are needed. These include:  Canes.  Walkers.  Scooters.  Crutches.  Turn on the lights when you go into a dark area. Replace any light bulbs as soon as they burn out.  Set up your furniture so you have a clear path. Avoid moving your furniture around.  If any of your floors are uneven, fix them.  If there are any pets around you, be aware of where they are.  Review your medicines with your doctor. Some medicines can make you feel dizzy. This can increase your chance of falling. Ask your doctor what other things that you can do to help prevent falls. This information is not intended to replace advice given to you by your health care provider. Make sure you discuss any questions you have with your health care provider. Document Released: 03/25/2009 Document Revised: 11/04/2015 Document Reviewed: 07/03/2014 Elsevier Interactive Patient Education  2017 [employer].

## 2020-04-26 NOTE — Progress Notes (Signed)
Virtual Visit via Telephone Note  I connected with  Joanne Brock on 04/26/20 at 10:15 AM EST by telephone and verified that I am speaking with the correct person using two identifiers.  Medicare Annual Wellness visit completed telephonically due to Covid-19 pandemic.   Persons participating in this call: This Health Coach and this patient along with Husband Joanne Brock  Location: Patient: Home Provider: Office   I discussed the limitations, risks, security and privacy concerns of performing an evaluation and management service by telephone and the availability of in person appointments. The patient expressed understanding and agreed to proceed.  Unable to perform video visit due to video visit attempted and failed and/or patient does not have video capability.   Some vital signs may be absent or patient reported.   Joanne Brace, LPN    Subjective:   JOYCELIN Brock is a 69 y.o. female who presents for Medicare Annual (Subsequent) preventive examination.  Review of Systems     Cardiac Risk Factors include: advanced age (>34mn, >>71women);dyslipidemia     Objective:    There were no vitals filed for this visit. There is no height or weight on file to calculate BMI.  Advanced Directives 04/26/2020 03/09/2020 01/27/2020  Does Patient Have a Medical Advance Directive? Yes Yes Yes  Type of AParamedicof AF[place]Living will [place] will;Out of facility DNR (pink MOST or yellow form) HAvonOut of facility DNR (pink MOST or yellow form);Living will  Copy of HDawesin Chart? No - copy requested - -    Current Medications (verified) Outpatient Encounter Medications as of 04/26/2020  Medication Sig  . Multiple Vitamin (MULTIVITAMIN) capsule Take 1 capsule by mouth daily.  . rizatriptan (MAXALT) 10 MG tablet Take 10 mg by mouth as needed for migraine. May repeat in 2 hours if needed  .  Vitamin D, Ergocalciferol, (DRISDOL) 1.25 MG (50000 UNIT) CAPS capsule Take 1 capsule (50,000 Units total) by mouth every 7 (seven) days.  .Marland Kitchendonepezil (ARICEPT) 10 MG tablet Take 1/2 tablet daily for 2 weeks, then increase to 1 tablet daily (Patient not taking: Reported on 04/26/2020)   No facility-administered encounter medications on file as of 04/26/2020.    Allergies (verified) Patient has no known allergies.   History: Past Medical History:  Diagnosis Date  . Allergy   . [place]    basal cell and squamous cell skin cancer  . History of skin cancer    squamous and basal x 8 in 2018. Dr. HRenda Rolls Had been with Dr. GTonia Brooms   . Kidney stone    x1  . Migraines    Aura- smell, spots. once a month- sumatriptan. topamax 575monce a day prophylaxis.   . Osteopenia    Past Surgical History:  Procedure Laterality Date  . COLONOSCOPY    . TONSILLECTOMY Bilateral 1960   Family History  Problem Relation Age of Onset  . Hypertension Mother   . Stroke Mother        TIAs- declined over years and stroke eventually. died 854. Other Father        decline after ? heat stroke. died around 8052. Stroke Sister        [id number].   . Marland Kitchenlcerative colitis Daughter   . Colon cancer Neg Hx   . Colon polyps Neg Hx   . Esophageal cancer Neg Hx   . Stomach cancer Neg Hx   .  Rectal cancer Neg Hx    Social History   Socioeconomic History  . Marital status: Married    Spouse name: Not on file  . Number of children: Not on file  . Years of education: Not on file  . Highest education level: Not on file  Occupational History  . Occupation: retired  Tobacco Use  . Smoking status: Never Smoker  . Smokeless tobacco: Never Used  Vaping Use  . Vaping Use: Never used  Substance and Sexual Activity  . Alcohol use: No    Alcohol/week: 0.0 standard drinks  . Drug use: No  . Sexual activity: Yes  Other Topics Concern  . Not on file  Social History Narrative   Married over 40 years  in 2018. 1 daughter 23 years old in 2018. No grandkids- 2 grandpets.    Had to spend great deal of time caring for aging parents- now passed.       Undergrad at [employer].    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 [employer]: travel   Right Handed   One Story Home   Does drink caffeine occasionally   Social Determinants of Health   Financial Resource Strain: Low Risk   . Difficulty of Paying Living Expenses: Not hard at all  Food Insecurity: No Food Insecurity  . Worried About Charity fundraiser in the Last Year: Never true  . Ran Out of Food in the Last Year: Never true  Transportation Needs: No Transportation Needs  . Lack of Transportation (Medical): No  . Lack of Transportation (Non-Medical): No  Physical Activity: Sufficiently Active  . Days of Exercise per Week: 5 days  . Minutes of Exercise per Session: 50 min  Stress: No Stress Concern Present  . Feeling of Stress : Not at all  Social Connections: Moderately Integrated  . Frequency of Communication with Friends and Family: More than three times a week  . Frequency of Social Gatherings with Friends and Family: Once a week  . Attends Religious Services: More than 4 times per year  . Active Member of Clubs or Organizations: No  . Attends Archivist Meetings: Never  . Marital Status: Married    Tobacco Counseling Counseling given: Not Answered   Clinical Intake:  Pre-visit preparation completed: Yes  Pain : No/denies pain     BMI - recorded: 19.35 Nutritional Status: BMI of 19-24  Normal Nutritional Risks: Nausea/ vomitting/ diarrhea (nausea at times with medication) Diabetes: No  How often do you need to have someone help you when you read instructions, pamphlets, or other written materials from your doctor or pharmacy?: 1 - Never  Diabetic?No  Interpreter Needed?: No  Information entered by :: Charlott Rakes, LPN   Activities of Daily Living In your present state of  health, do you have any difficulty performing the following activities: 04/26/2020 04/23/2020  Hearing? N N  Vision? N N  Difficulty concentrating or making decisions? Y N  Comment occassionally -  Walking or climbing stairs? N N  Dressing or bathing? N N  Doing errands, shopping? N N  Preparing Food and eating ? N -  Using the Toilet? N -  In the past six months, have you accidently leaked urine? N -  Do you have problems with loss of bowel control? N -  Managing your Medications? N -  Managing your Finances? N -  Housekeeping or managing your Housekeeping? N -  Some recent data  might be hidden    Patient Care Team: Marin Olp, MD as PCP - General (Family Medicine) Renda Rolls, Jennefer Bravo, MD as Referring Physician (Dermatology) Paula Compton, MD as Consulting Physician (Obstetrics and Gynecology) Marilynne Halsted, MD as Referring Physician (Ophthalmology) Cameron Sprang, MD as Consulting Physician (Neurology)  Indicate any recent Medical Services you may have received from other than [facility] providers in the past year (date may be approximate).     Assessment:   This is a routine wellness examination for Joanne Brock.  Hearing/Vision screen  Hearing Screening   125Hz 250Hz 500Hz 1000Hz 2000Hz 3000Hz 4000Hz 6000Hz 8000Hz  Right ear:           Left ear:           Comments: No hearing issues   Vision Screening Comments: Pt follows up annually with Dr Darnell Level for eye exams  Dietary issues and exercise activities discussed: Current Exercise Habits: Home exercise routine, Type of exercise: walking, Time (Minutes): 50, Frequency (Times/Week): 5, Weekly Exercise (Minutes/Week): 250  Goals    . Patient Stated     Not at this times      Depression Screen PHQ 2/9 Scores 04/26/2020 04/23/2020 10/21/2019 06/27/2019 04/23/2019 04/17/2018 02/08/2017  PHQ - 2 Score 0 0 0 3 0 0 0  PHQ- 9 Score - 0 - 12 0 - -    Fall Risk Fall Risk  04/26/2020 03/09/2020 01/27/2020 04/23/2019  04/17/2018  Falls in the past year? 0 1 0 0 0  Number falls in past yr: 0 0 0 0 -  Injury with Fall? 0 1 0 0 -  Risk for fall due to : Impaired vision - - - -  Follow up Falls prevention discussed - - - -    Any stairs in or around the home? Yes  If so, are there any without handrails? No  Home free of loose throw rugs in walkways, pet beds, electrical cords, etc? Yes  Adequate lighting in your home to reduce risk of falls? Yes   ASSISTIVE DEVICES UTILIZED TO PREVENT FALLS:  Life alert? No  Use of a cane, walker or w/c? No  Grab bars in the bathroom? No  Shower chair or bench in shower? Yes  Elevated toilet seat or a handicapped toilet? No   TIMED UP AND GO:  Was the test performed? No .      Cognitive Function: 6CIT declined  MMSE - Mini Mental State Exam 10/21/2019  Orientation to time 2  Orientation to Place 4  Registration 3  Attention/ Calculation 3  Recall 3  Language- name 2 objects 1  Language- repeat 1  Language- follow 3 step command 3  Language- read & follow direction 1  Write a sentence 1  Copy design 1  Total score 23   Montreal Cognitive Assessment  02/25/2020  Visuospatial/ Executive (0/5) 2  Naming (0/3) 2  Attention: Read list of digits (0/2) 1  Attention: Read list of letters (0/1) 1  Attention: Serial 7 subtraction starting at 100 (0/3) 0  Language: Repeat phrase (0/2) 2  Language : Fluency (0/1) 0  Abstraction (0/2) 1  Delayed Recall (0/5) 0  Orientation (0/6) 5  Total 14  Adjusted Score (based on education) 14      Immunizations Immunization History  Administered Date(s) Administered  . Fluad Quad(high Dose 65+) 03/15/2020  . Hepatitis A 11/05/2009  . Influenza, High Dose Seasonal PF 03/19/2018, 03/24/2019  . Influenza,inj,Quad PF,6+ Mos 04/03/2017  . Influenza-Unspecified 03/03/2009,  03/09/2010, 03/14/2011, 03/19/2012, 03/31/2013, 03/27/2014, 04/02/2015, 04/11/2016  . MMR 11/12/2017  . PFIZER SARS-COV-2 Vaccination 07/01/2019,  07/21/2019, 04/15/2020  . Pneumococcal Conjugate-13 11/12/2017  . Pneumococcal Polysaccharide-23 04/23/2019  . Tdap 06/12/2012  . Zoster 03/20/2013  . Zoster Recombinat (Shingrix) 04/29/2019, 09/30/2019    TDAP status: Up to date   Flu Vaccine status: Up to date done 03/15/20  Pneumococcal vaccine status: Up to date Covid-19 vaccine status: Completed vaccines  Qualifies for Shingles Vaccine? Yes   Zostavax completed Yes   Shingrix Completed?: Yes  Screening Tests Health Maintenance  Topic Date Due  . MAMMOGRAM  05/19/2020 (Originally 03/13/2020)  . TETANUS/TDAP  06/12/2022  . COLONOSCOPY  05/22/2028  . INFLUENZA VACCINE  Completed  . DEXA SCAN  Completed  . COVID-19 Vaccine  Completed  . Hepatitis C Screening  Completed  . PNA vac Low Risk Adult  Completed    Health Maintenance  There are no preventive care reminders to display for this patient.  Colorectal cancer screening: Completed 05/22/18. Repeat every 10 years Mammogram status: Completed 03/14/19. Repeat every year Bone Density status: Completed 11/12/19. Results reflect: Bone density results: OSTEOPENIA. Repeat every 2 years.    Additional Screening:  Hepatitis C Screening: Completed 04/16/17  Vision Screening: Recommended annual ophthalmology exams for early detection of glaucoma and other disorders of the eye. Is the patient up to date with their annual eye exam?  Yes  Who is the provider or what is the name of the office in which the patient attends annual eye exams? Dr Darnell Level for annual eye exams   Dental Screening: Recommended annual dental exams for proper oral hygiene  Community Resource Referral / Chronic Care Management: CRR required this visit?  No   CCM required this visit?  No      Plan:     I have personally reviewed and noted the following in the patient's chart:   . Medical and social history . Use of alcohol, tobacco or illicit drugs  . Current medications and supplements . Functional  ability and status . Nutritional status . Physical activity . Advanced directives . List of other physicians . Hospitalizations, surgeries, and ER visits in previous 12 months . Vitals . Screenings to include cognitive, depression, and falls . Referrals and appointments  In addition, I have reviewed and discussed with patient certain preventive protocols, quality metrics, and best practice recommendations. A written personalized care plan for preventive services as well as general preventive health recommendations were provided to patient.     Joanne Brace, LPN   [id number]   Nurse Notes: None

## 2020-05-12 ENCOUNTER — Other Ambulatory Visit: Payer: Self-pay | Admitting: Family Medicine

## 2020-05-31 DIAGNOSIS — Z1231 Encounter for screening mammogram for malignant neoplasm of breast: Secondary | ICD-10-CM | POA: Diagnosis not present

## 2020-05-31 LAB — HM MAMMOGRAPHY: HM Mammogram: NORMAL (ref 0–4)

## 2020-06-02 ENCOUNTER — Encounter: Payer: Self-pay | Admitting: Family Medicine

## 2020-07-12 ENCOUNTER — Other Ambulatory Visit: Payer: Self-pay | Admitting: Family Medicine

## 2020-07-20 ENCOUNTER — Other Ambulatory Visit: Payer: Self-pay

## 2020-07-20 ENCOUNTER — Encounter: Payer: Self-pay | Admitting: Family Medicine

## 2020-07-20 ENCOUNTER — Ambulatory Visit (INDEPENDENT_AMBULATORY_CARE_PROVIDER_SITE_OTHER): Payer: Medicare Other | Admitting: Family Medicine

## 2020-07-20 VITALS — BP 118/70 | HR 83 | Temp 98.3°F | Ht 63.0 in | Wt 107.4 lb

## 2020-07-20 DIAGNOSIS — R634 Abnormal weight loss: Secondary | ICD-10-CM

## 2020-07-20 DIAGNOSIS — E559 Vitamin D deficiency, unspecified: Secondary | ICD-10-CM | POA: Diagnosis not present

## 2020-07-20 DIAGNOSIS — R197 Diarrhea, unspecified: Secondary | ICD-10-CM

## 2020-07-20 LAB — CBC WITH DIFFERENTIAL/PLATELET
Basophils Absolute: 0 10*3/uL (ref 0.0–0.1)
Basophils Relative: 0.2 % (ref 0.0–3.0)
Eosinophils Absolute: 0 10*3/uL (ref 0.0–0.7)
Eosinophils Relative: 0.2 % (ref 0.0–5.0)
HCT: 38 % (ref 36.0–46.0)
Hemoglobin: 12.9 g/dL (ref 12.0–15.0)
Lymphocytes Relative: 26.4 % (ref 12.0–46.0)
Lymphs Abs: 1.2 10*3/uL (ref 0.7–4.0)
MCHC: 33.9 g/dL (ref 30.0–36.0)
MCV: 85.5 fl (ref 78.0–100.0)
Monocytes Absolute: 0.4 10*3/uL (ref 0.1–1.0)
Monocytes Relative: 7.9 % (ref 3.0–12.0)
Neutro Abs: 3 10*3/uL (ref 1.4–7.7)
Neutrophils Relative %: 65.3 % (ref 43.0–77.0)
Platelets: 192 10*3/uL (ref 150.0–400.0)
RBC: 4.44 Mil/uL (ref 3.87–5.11)
RDW: 13.1 % (ref 11.5–15.5)
WBC: 4.6 10*3/uL (ref 4.0–10.5)

## 2020-07-20 LAB — COMPREHENSIVE METABOLIC PANEL
ALT: 8 U/L (ref 0–35)
AST: 19 U/L (ref 0–37)
Albumin: 4.4 g/dL (ref 3.5–5.2)
Alkaline Phosphatase: 74 U/L (ref 39–117)
BUN: 13 mg/dL (ref 6–23)
CO2: 31 mEq/L (ref 19–32)
Calcium: 9.7 mg/dL (ref 8.4–10.5)
Chloride: 101 mEq/L (ref 96–112)
Creatinine, Ser: 0.9 mg/dL (ref 0.40–1.20)
GFR: 65.97 mL/min (ref >60.00)
Glucose, Bld: 82 mg/dL (ref 70–99)
Potassium: 4.1 mEq/L (ref 3.5–5.1)
Sodium: 137 mEq/L (ref 135–145)
Total Bilirubin: 0.6 mg/dL (ref 0.2–1.2)
Total Protein: 7.3 g/dL (ref 6.0–8.3)

## 2020-07-20 LAB — VITAMIN D 25 HYDROXY (VIT D DEFICIENCY, FRACTURES): VITD: 58.01 ng/mL (ref 30.00–100.00)

## 2020-07-20 LAB — TSH: TSH: 1.83 u[IU]/mL (ref 0.35–4.50)

## 2020-07-20 MED ORDER — ONDANSETRON 4 MG PO TBDP: 4.0000 mg | ORAL_TABLET | Freq: Three times a day (TID) | ORAL | 1 refills | Status: DC | PRN

## 2020-07-20 NOTE — Progress Notes (Signed)
Phone [phone] In person visit   Subjective:   [name] is a 70 y.o. year old very pleasant female patient who presents for/with See problem oriented charting Chief Complaint  Patient presents with  . Stomach Issues    Stomach issues with her new Aricept medication     This visit occurred during the SARS-CoV-2 public health emergency.  Safety protocols were in place, including screening questions prior to the visit, additional usage of staff PPE, and extensive cleaning of exam room while observing appropriate contact time as indicated for disinfecting solutions.   Past Medical History-  Patient Active Problem List   Diagnosis Date Noted  . Late onset Alzheimer's disease without behavioral disturbance ([place]) 03/04/2020    Priority: High  . Osteopenia 04/16/2017    Priority: Medium  . History of adenomatous polyp of colon 02/20/2017    Priority: Medium  . Hyperlipidemia 02/08/2017    Priority: Medium  . Migraines     Priority: Medium  . Morton neuroma, left 10/02/2017    Priority: Low  . Vitamin D deficiency 02/20/2017    Priority: Low  . History of skin cancer     Priority: Low    Medications- reviewed and updated Current Outpatient Medications  Medication Sig Dispense Refill  . Multiple Vitamin (MULTIVITAMIN) capsule Take 1 capsule by mouth daily.    . ondansetron (ZOFRAN ODT) 4 MG disintegrating tablet Take 1 tablet (4 mg total) by mouth every 8 (eight) hours as needed for nausea or vomiting. 30 tablet 1  . rizatriptan (MAXALT) 10 MG tablet TAKE 1 TABLET BY MOUTH AS NEEDED FOR MIGRAINE. MAY REPEAT IN 2 HOURS IF NEEDED 10 tablet 11  . Vitamin D, Ergocalciferol, (DRISDOL) 1.25 MG (50000 UNIT) CAPS capsule TAKE 1 CAPSULE (50,000 UNITS TOTAL) BY MOUTH EVERY 7 (SEVEN) DAYS 12 capsule 0  . donepezil (ARICEPT) 10 MG tablet Take 1/2 tablet daily for 2 weeks, then increase to 1 tablet daily (Patient not taking: Reported on 07/20/2020) 30 tablet 11   No current  facility-administered medications for this visit.     Objective:  BP 118/70   Pulse 83   Temp 98.3 F (36.8 C) (Temporal)   Ht 5\' 3"  (1.6 m)   Wt 107 lb 6.4 oz (48.7 kg)   SpO2 98%   BMI 19.03 kg/m  Gen: NAD, resting comfortably No significant thyromegaly CV: RRR no murmurs rubs or gallops Lungs: CTAB no crackles, wheeze, rhonchi Abdomen: soft/nontender other than mild pain in epigastric and suprapubic area/nondistended/normal bowel sounds. No rebound or guarding.  Ext: no edema Skin: warm, dry Neuro: Due to memory changes husband answer several questions for patient-she looks to him for answers    Assessment and Plan  F/U on stomach issues on Aricept for Alzheimer's S: Patient taking donepezil in the evening at last visit-she had nausea when she increased to 10 mg but unfortunately when she went back to 5 mg dose she was still having some nausea but less severe.  We opted to try off medicine for 2 weeks and then retrial 5 mg.  Patient had improvement in her symptoms but no clear-cut resolution during 2 weeks off medicine.  During the 2-week period had 3 days of perhaps 3 hours of either nausea or diarrhea but had no other days during that period.  From January 6-22 she experienced 2 full days of nausea and diarrhea.  We opted to try off for another 4 weeks and continue to track symptoms.   She is  up-to-date on colonoscopy as of 05/22/2018 with 10-year repeat planned.  No blood in stool or melena   Today the report is- first 10 day period was moderate- half involved short lived nausea/diarrhea. Most incidences followed a meal. Then had 10 days of no symptoms. The last 9 days have been 9 for 9 with nausea and diarrhea. Yesterday required bedrest 4-5 hours. She is also finishing last weekly dose of 50k units of vitamin D tomorrow.   Yesterday 2-3 diarrheal bowel movements. Can have multiple BMs a day. Always starts with nausea followed by diarrhea.   Weight down about 9 lbs from peak  on may 11- not eating much.   No recent antibiotics.   A/P: 70 year old female that started with nausea/diarrhea after starting donepezil but with recurrent issues despite stopping medication.  It is certainly possible that nausea/diarrhea are unrelated to the donepezil at this point.  She also has been taking vitamin D high-dose which could contribute-we opted to simply trial off this medicine and her multivitamin as well for up to a month and she will remain off the donepezil which would be approximately 60 days at that point.  She does not feel ill overall other than during the episodes themselves  We will also get some updated blood work-I am particularly interested in her thyroid levels with weight loss and diarrhea.  With ongoing loose stools/diarrhea we will get stool studies/GI pathogen panel.  We also will trial symptomatic relief with Zofran/ondansetron.  I do not like that she has unintentionally lost 9 pounds but I think the poor p.o. intake is obviously to blame here-we need to follow her weight trend  Patient/family are going to update me on my chart within a month.  We also discussed possible GI referral versus abdominal imaging-exam with mild pain epigastric and suprapubic  Recommended follow up: 2-week monitor follow-up-we discussed sooner follow-up if new or worsening symptoms Future Appointments  Date Time Provider [place]  09/07/2020 11:30 AM Cameron Sprang, MD [facility] None  05/09/2021 11:00 AM [facility] HEALTH COACH [facility]    Lab/Order associations:   ICD-10-CM   1. Diarrhea, unspecified type  R19.7 GI Profile, Stool, PCR    CBC with Differential/Platelet    Comprehensive metabolic panel    TSH  2. Unintentional weight loss  R63.4 CBC with Differential/Platelet    Comprehensive metabolic panel    TSH  3. Vitamin D deficiency  E55.9 VITAMIN D 25 Hydroxy (Vit-D Deficiency, Fractures)    Meds ordered this encounter  Medications  . ondansetron  (ZOFRAN ODT) 4 MG disintegrating tablet    Sig: Take 1 tablet (4 mg total) by mouth every 8 (eight) hours as needed for nausea or vomiting.    Dispense:  30 tablet    Refill:  1    Time Spent: 31 minutes of total time (1:08 PM- 1:39 PM) was spent on the date of the encounter performing the following actions: chart review prior to seeing the patient, obtaining history, performing a medically necessary exam, counseling on the treatment plan, placing orders, and documenting in our EHR.   Return precautions advised.  Garret Reddish, MD

## 2020-07-20 NOTE — Patient Instructions (Addendum)
  Health Maintenance Due  Topic Date Due  . MAMMOGRAM Will call back with dates. Could also Sign release of information at the check out desk  03/13/2020   Please stop by lab to pick up stool collection kit.  Try Zofran/ondansetron for nausea-can be used perhaps before breakfast and before dinner on rough days to try to help you keep some food and liquids down-I would really like to avoid further weight loss.  Please give me an update on 2 weeks-lites remain off all medicines other than Zofran as needed including skipping next vitamin D dose.   Please stop by lab before you go If you have mychart- we will send your results within 3 business days of [place] receiving them.  If you do not have mychart- we will call you about results within 5 business days of [place] receiving them.  *please also note that you will see labs on mychart as soon as they post. I will later go in and write notes on them- will say "notes from Dr. Yong Channel"    Recommended follow up: 2 weeks by [facility]

## 2020-07-21 ENCOUNTER — Encounter: Payer: Self-pay | Admitting: Family Medicine

## 2020-07-21 DIAGNOSIS — H18453 Nodular corneal degeneration, bilateral: Secondary | ICD-10-CM | POA: Diagnosis not present

## 2020-07-21 DIAGNOSIS — L718 Other rosacea: Secondary | ICD-10-CM | POA: Diagnosis not present

## 2020-07-21 DIAGNOSIS — H2513 Age-related nuclear cataract, bilateral: Secondary | ICD-10-CM | POA: Diagnosis not present

## 2020-07-22 ENCOUNTER — Other Ambulatory Visit: Payer: Medicare Other

## 2020-07-22 DIAGNOSIS — K529 Noninfective gastroenteritis and colitis, unspecified: Secondary | ICD-10-CM | POA: Diagnosis not present

## 2020-07-22 DIAGNOSIS — R197 Diarrhea, unspecified: Secondary | ICD-10-CM

## 2020-07-22 NOTE — Addendum Note (Signed)
Addended by: Brandy Hale on: 07/22/2020 09:43 AM   Modules accepted: Orders

## 2020-07-26 LAB — GI PROFILE, STOOL, PCR

## 2020-07-27 ENCOUNTER — Encounter: Payer: Self-pay | Admitting: Family Medicine

## 2020-08-16 ENCOUNTER — Encounter: Payer: Self-pay | Admitting: Family Medicine

## 2020-08-18 ENCOUNTER — Ambulatory Visit (INDEPENDENT_AMBULATORY_CARE_PROVIDER_SITE_OTHER): Payer: Medicare Other | Admitting: Family Medicine

## 2020-08-18 ENCOUNTER — Telehealth: Payer: Self-pay

## 2020-08-18 ENCOUNTER — Other Ambulatory Visit: Payer: Self-pay

## 2020-08-18 ENCOUNTER — Encounter: Payer: Self-pay | Admitting: Family Medicine

## 2020-08-18 VITALS — BP 140/84 | HR 88 | Temp 97.9°F | Wt 107.0 lb

## 2020-08-18 DIAGNOSIS — G43019 Migraine without aura, intractable, without status migrainosus: Secondary | ICD-10-CM | POA: Diagnosis not present

## 2020-08-18 MED ORDER — PROMETHAZINE HCL 25 MG/ML IJ SOLN
25.0000 mg | Freq: Once | INTRAMUSCULAR | Status: AC
Start: 2020-08-18 — End: 2020-08-18
  Administered 2020-08-18: 25 mg via INTRAMUSCULAR

## 2020-08-18 MED ORDER — KETOROLAC TROMETHAMINE 30 MG/ML IJ SOLN: 30.0000 mg | Freq: Once | INTRAMUSCULAR | Status: DC

## 2020-08-18 MED ORDER — KETOROLAC TROMETHAMINE 60 MG/2ML IM SOLN
60.0000 mg | Freq: Once | INTRAMUSCULAR | Status: AC
  Administered 2020-08-18: 30 mg via INTRAMUSCULAR

## 2020-08-18 NOTE — Progress Notes (Signed)
Subjective:    Patient ID: Joanne Brock, female    DOB: 1950/11/05, 70 y.o.   MRN: [id number]  Chief Complaint  Patient presents with  . Headache    Here with husband as historian. H/O migraines. Took a rizatriptan at 5:30 am. Took another about 8:15. Med does not seem to be working. Some dry heaving.   Pt accompanied by her husband.  HPI Patient is a 70 yo female with pmh sig for migraines, late onset Alzheimer's, seasonal allergy, HLD, history of skin cancer who was seen today for acute concern.  Per pt's husband, pt with a migraine that started overnight.  Pt took maxalt at 5:30 am, repeated dose at 8:15 am.  Also tried a warm compress.  Symptoms continued so they called clinic, advised to try Ibuprofen 800 mg.  Pt with continued sharp L frontal pain, dry heaving, sensitivity to light.  Pt denies changes in vision, ear pain/pressure, facial pain/pressure.  Pt with decreased appetite yesterday.  Drinking 4-5 large glasses of water per day.  Past Medical History:  Diagnosis Date  . Allergy   . Cancer ([place])    basal cell and squamous cell skin cancer  . History of skin cancer    squamous and basal x 8 in 2018. Dr. Renda Rolls. Had been with Dr. Tonia Brooms.   . Kidney stone    x1  . Migraines    Aura- smell, spots. once a month- sumatriptan. topamax 50mg  once a day prophylaxis.   . Osteopenia     No Known Allergies  ROS General: Denies fever, chills, night sweats, changes in weight, changes in appetite +memory deficit HEENT: Denies ear pain, changes in vision, rhinorrhea, sore throat +migraine, sensitivity to light CV: Denies CP, palpitations, SOB, orthopnea Pulm: Denies SOB, cough, wheezing GI: Denies abdominal pain, nausea, vomiting, diarrhea, constipation GU: Denies dysuria, hematuria, frequency, vaginal discharge +dry heaving Msk: Denies muscle cramps, joint pains Neuro: Denies weakness, numbness, tingling Skin: Denies rashes, bruising Psych: Denies depression, anxiety,  hallucinations     Objective:    Blood pressure 140/84, pulse 88, temperature 97.9 F (36.6 C), weight 107 lb (48.5 kg), SpO2 97 %.  Gen. Pleasant, well-nourished, in no distress, normal affect   HEENT: Dresser/AT, face symmetric, no facial droop noted, conjunctiva clear, no scleral icterus, PERRLA, no nystagmus, EOMI, nares patent without drainage, pharynx without erythema or exudate.  TMs full b/l. Neck: No JVD, no thyromegaly, no carotid bruits Lungs: no accessory muscle use, CTAB, no wheezes or rales Cardiovascular: RRR, no m/r/g, no peripheral edema Musculoskeletal: No deformities, no cyanosis or clubbing, normal tone Neuro:  A&Ox2 (person and place), CN II-XII intact, normal gait.   Skin:  Warm, no lesions/ rash   Wt Readings from Last 3 Encounters:  08/18/20 107 lb (48.5 kg)  07/20/20 107 lb 6.4 oz (48.7 kg)  04/23/20 109 lb 3.2 oz (49.5 kg)    Lab Results  Component Value Date   WBC 4.6 07/20/2020   HGB 12.9 07/20/2020   HCT 38.0 07/20/2020   PLT 192.0 07/20/2020   GLUCOSE 82 07/20/2020   CHOL 238 (H) 04/23/2020   TRIG 97 04/23/2020   HDL 66 04/23/2020   LDLCALC 151 (H) 04/23/2020   ALT 8 07/20/2020   AST 19 07/20/2020   NA 137 07/20/2020   K 4.1 07/20/2020   CL 101 07/20/2020   CREATININE 0.90 07/20/2020   BUN 13 07/20/2020   CO2 31 07/20/2020   TSH 1.83 07/20/2020    Assessment/Plan:  Intractable migraine without aura and without status migrainosus  -neuro exam reassuring -discussed HA prevention.  Avoid overuse of Ibuprofen as may cause rebound HAs. -will given toradol and phenergan in clinic.  Given precautions -consider f/u with [place], Dr. Delice Lesch - Plan: ketorolac (TORADOL) 30 MG/ML injection 30 mg, promethazine (PHENERGAN) injection 25 mg  F/u prn  Grier Mitts, MD

## 2020-08-18 NOTE — Patient Instructions (Signed)

## 2020-08-18 NOTE — Telephone Encounter (Signed)
Unfortunately max dose for rizatriptan is 20 mg per 24 hours.  She may not take another dose  May cause some stomach sensitivity but could try 800 mg of ibuprofen-sometimes the combination of ibuprofen along with rizatriptan for migraines is more effective  You can also offer her a visit with one of my colleagues within [place]-unfortunately I am not here this afternoon to be able to see her  If this migraine is different or more intense than regular migraines-I would recommend she be seen

## 2020-08-18 NOTE — Telephone Encounter (Signed)
Please advise 

## 2020-08-18 NOTE — Telephone Encounter (Signed)
Patient has been scheduled for an appointment today at 3:30pm with shannon banks.

## 2020-08-18 NOTE — Telephone Encounter (Signed)
Patient  Is suffering from migraine and has taken rizatriptan (MAXALT) 10 MG tablet  as prescribed she took one at around 6 am and another around 8 am and has not taken one since then, she would like to know if she able to take another or if she needs an appt.

## 2020-09-07 ENCOUNTER — Ambulatory Visit: Payer: Medicare Other | Admitting: Neurology

## 2020-09-20 DIAGNOSIS — L821 Other seborrheic keratosis: Secondary | ICD-10-CM | POA: Diagnosis not present

## 2020-09-20 DIAGNOSIS — Z808 Family history of malignant neoplasm of other organs or systems: Secondary | ICD-10-CM | POA: Diagnosis not present

## 2020-09-20 DIAGNOSIS — Z85828 Personal history of other malignant neoplasm of skin: Secondary | ICD-10-CM | POA: Diagnosis not present

## 2020-09-20 DIAGNOSIS — Z86018 Personal history of other benign neoplasm: Secondary | ICD-10-CM | POA: Diagnosis not present

## 2020-09-20 DIAGNOSIS — L570 Actinic keratosis: Secondary | ICD-10-CM | POA: Diagnosis not present

## 2020-09-20 DIAGNOSIS — L578 Other skin changes due to chronic exposure to nonionizing radiation: Secondary | ICD-10-CM | POA: Diagnosis not present

## 2020-09-20 DIAGNOSIS — L814 Other melanin hyperpigmentation: Secondary | ICD-10-CM | POA: Diagnosis not present

## 2020-09-20 DIAGNOSIS — D225 Melanocytic nevi of trunk: Secondary | ICD-10-CM | POA: Diagnosis not present

## 2020-10-28 DIAGNOSIS — Z23 Encounter for immunization: Secondary | ICD-10-CM | POA: Diagnosis not present

## 2020-11-10 ENCOUNTER — Encounter: Payer: Self-pay | Admitting: Podiatry

## 2020-11-10 ENCOUNTER — Ambulatory Visit (INDEPENDENT_AMBULATORY_CARE_PROVIDER_SITE_OTHER): Payer: Medicare Other | Admitting: Podiatry

## 2020-11-10 ENCOUNTER — Other Ambulatory Visit: Payer: Self-pay

## 2020-11-10 DIAGNOSIS — M21619 Bunion of unspecified foot: Secondary | ICD-10-CM

## 2020-11-10 DIAGNOSIS — L600 Ingrowing nail: Secondary | ICD-10-CM | POA: Diagnosis not present

## 2020-11-10 NOTE — Progress Notes (Signed)
Subjective:   Patient ID: Joanne Brock, female   DOB: 70 y.o.   MRN: [id number]   HPI Patient presents with bunion deformity right along with chronic ingrown toenail deformity right big toe they get sore and make shoe gear difficult.  Patient states its been going on for several weeks and presents with caregiver and does not smoke and is not significantly active   Review of Systems  All other systems reviewed and are negative.       Objective:  Physical Exam Vitals and nursing note reviewed.  Constitutional:      Appearance: She is well-developed.  Pulmonary:     Effort: Pulmonary effort is normal.  Musculoskeletal:        General: Normal range of motion.  Skin:    General: Skin is warm.  Neurological:     Mental Status: She is alert.     Neurovascular status intact muscle strength was found to be adequate range of motion adequate moderate structural bunion deformity right over left with redness and slight discomfort with rotation of the big toe with incurvated medial border right hallux painful when pressed.  Good digital perfusion well oriented x3     Assessment:  Ingrown toenail deformity right hallux medial border no indication of infection with position of the big toe secondary to structural bunion part of the problem     Plan:  H&P reviewed condition recommended correction of deformity explained procedure risk and patient wants surgery understanding risk with consent form signed.  Today I infiltrated the right hallux 60 mg like Marcaine mixture sterile prep done and using sterile instrumentation remove the lateral border exposed matrix applied phenol 3 applications 30 seconds followed by alcohol lavage sterile dressing gave instructions for soaks leave dressing on 24 hours take off earlier if any throbbing were to occur.  Encouraged to call questions concerns which may arise

## 2020-11-10 NOTE — Patient Instructions (Signed)

## 2020-11-18 DIAGNOSIS — U071 COVID-19: Secondary | ICD-10-CM | POA: Diagnosis not present

## 2020-12-01 ENCOUNTER — Other Ambulatory Visit (INDEPENDENT_AMBULATORY_CARE_PROVIDER_SITE_OTHER): Payer: Medicare Other

## 2020-12-01 ENCOUNTER — Ambulatory Visit: Payer: Medicare Other

## 2020-12-01 ENCOUNTER — Other Ambulatory Visit: Payer: Self-pay

## 2020-12-01 DIAGNOSIS — Z111 Encounter for screening for respiratory tuberculosis: Secondary | ICD-10-CM

## 2020-12-01 NOTE — Progress Notes (Deleted)
qua 

## 2020-12-04 LAB — QUANTIFERON-TB GOLD PLUS
Mitogen-NIL: >10 IU/mL
NIL: 0.03 IU/mL
QuantiFERON-TB Gold Plus: NEGATIVE
TB1-NIL: 0 IU/mL
TB2-NIL: 0.01 IU/mL

## 2021-03-11 DIAGNOSIS — Z23 Encounter for immunization: Secondary | ICD-10-CM | POA: Diagnosis not present

## 2021-04-23 IMAGING — MR MR HEAD W/O CM
10 series · 48 of 48 positions shown · non-contrast
Comparison: 06/01/2003

CLINICAL DATA: Memory loss.  Worsening headaches.

EXAM:
MRI HEAD WITHOUT CONTRAST
TECHNIQUE: Multiplanar, multiecho pulse sequences of the brain and surrounding
structures were obtained without intravenous contrast.

[Series 5: T1 · sagittal · 4.0mm · 0.75mm/px · 4 of 31 slices shown (1 of 2)]
[im 1/31]
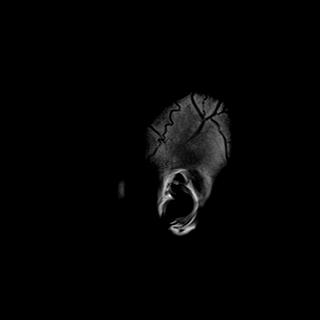
[im 11/31]
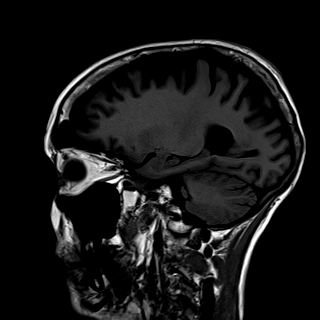
[im 21/31]
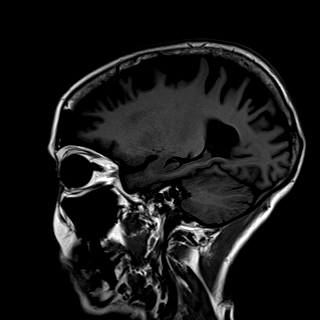
[im 31/31]
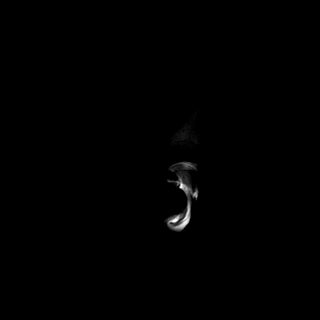

[Series 6: DWI · axial · 3.0mm · 1.44mm/px · z∈[-53,+82]mm · 7 of 84 slices shown (1 of 4)]
[im 1/84]
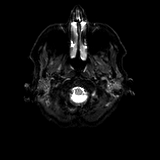
[im 14/84]
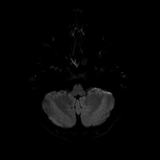
[im 28/84]
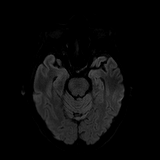
[im 42/84]
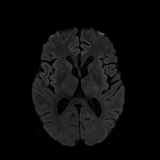
[im 56/84]
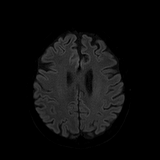
[im 70/84]
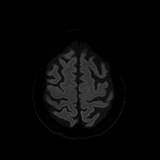
[im 84/84]
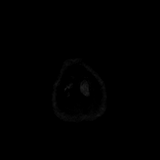

[Series 7: DWI · axial · 3.0mm · 1.44mm/px · z∈[-53,+82]mm · 3 of 42 slices shown (2 of 4)]
[im 1/42]
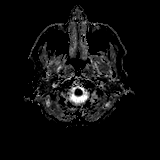
[im 21/42]
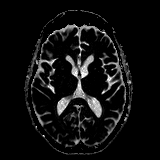
[im 42/42]
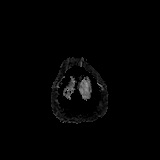

[Series 8: DWI · coronal · 5.0mm · 1.44mm/px · 5 of 60 slices shown (3 of 4)]
[im 1/60]
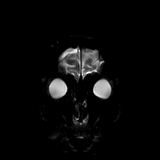
[im 15/60]
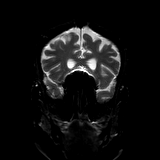
[im 30/60]
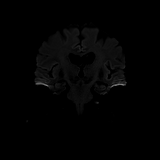
[im 45/60]
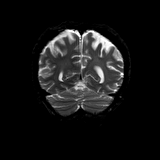
[im 60/60]
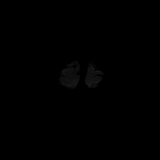

[Series 9: DWI · coronal · 5.0mm · 1.44mm/px · 2 of 30 slices shown (4 of 4)]
[im 1/30]
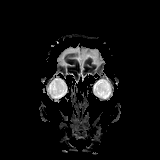
[im 30/30]
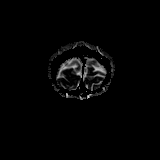

[Series 10: T2 · axial · 4.0mm · 0.36mm/px · z∈[-56,+80]mm · 2 of 27 slices shown (1 of 2)]
[im 1/27]
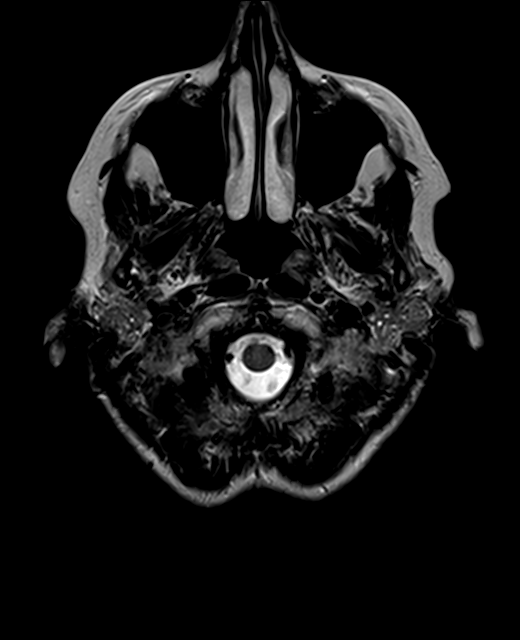
[im 27/27]
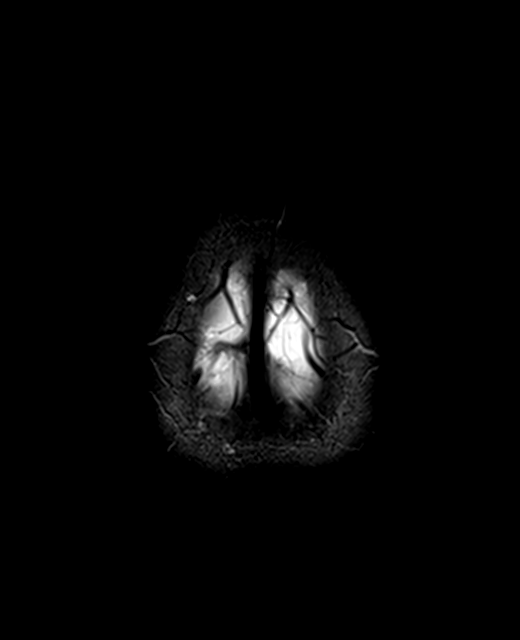

[Series 11: FLAIR · axial · 3.0mm · 0.72mm/px · z∈[-63,+87]mm · 2 of 26 slices shown]
[im 1/26]
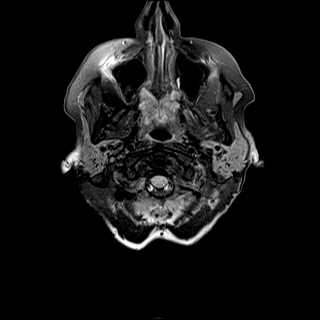
[im 26/26]
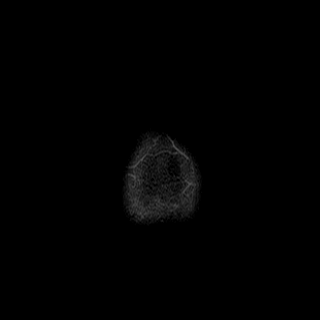

[Series 13: swi_images · axial · 1.5mm · 0.90mm/px · z∈[-59,+83]mm · 8 of 96 slices shown]
[im 1/96]
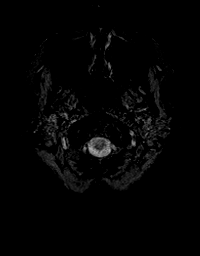
[im 14/96]
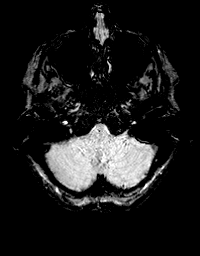
[im 28/96]
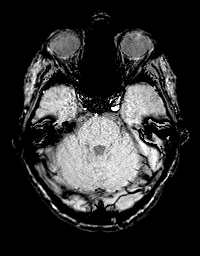
[im 41/96]
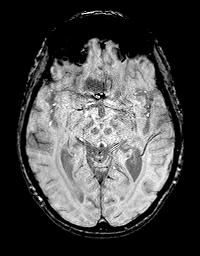
[im 55/96]
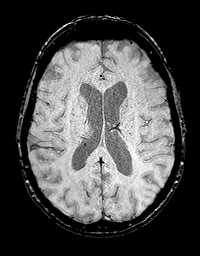
[im 68/96]
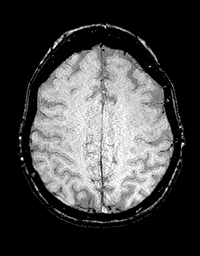
[im 82/96]
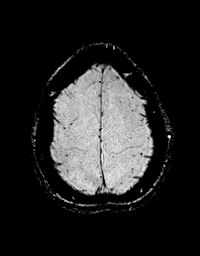
[im 96/96]
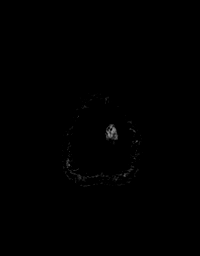

[Series 14: T1 · axial · 1.0mm · 0.94mm/px · z∈[-67,+91]mm · 13 of 159 slices shown (2 of 2)]
[im 1/159]
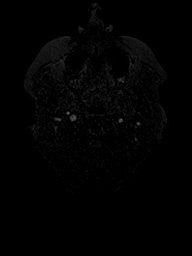
[im 14/159]
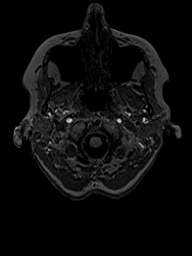
[im 27/159]
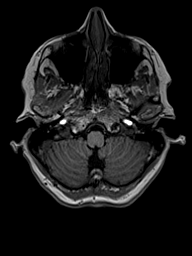
[im 40/159]
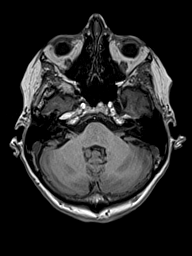
[im 53/159]
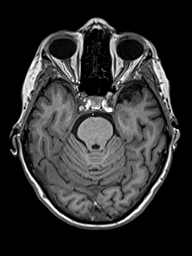
[im 66/159]
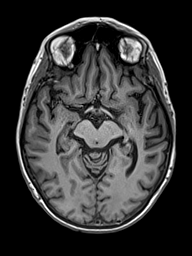
[im 80/159]
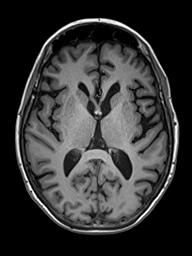
[im 93/159]
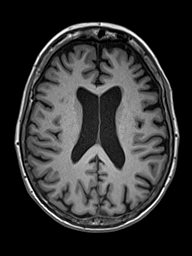
[im 106/159]
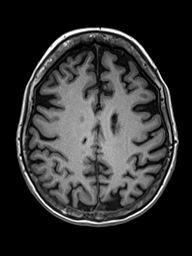
[im 119/159]
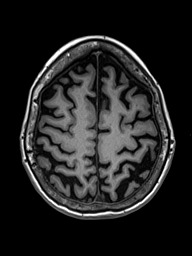
[im 132/159]
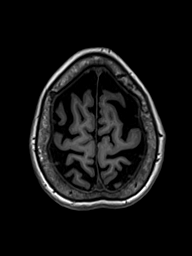
[im 145/159]
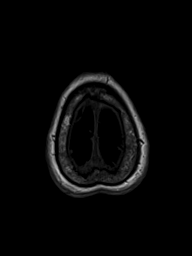
[im 159/159]
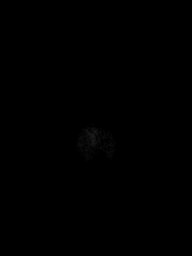

[Series 15: T2 · coronal · 4.5mm · 0.36mm/px · 2 of 30 slices shown (2 of 2)]
[im 1/30]
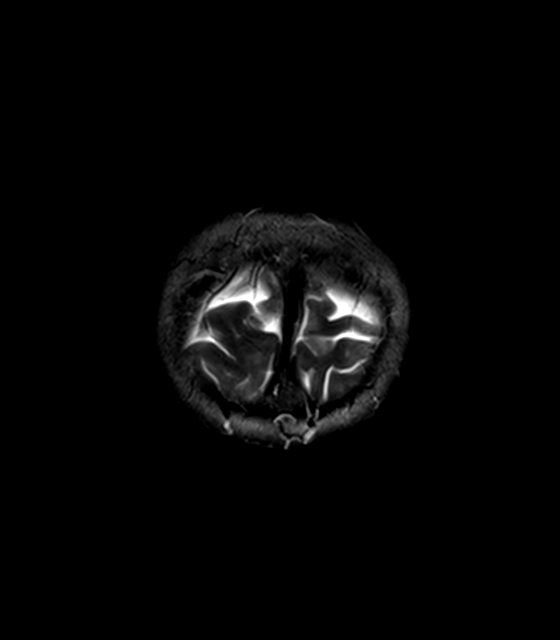
[im 30/30]
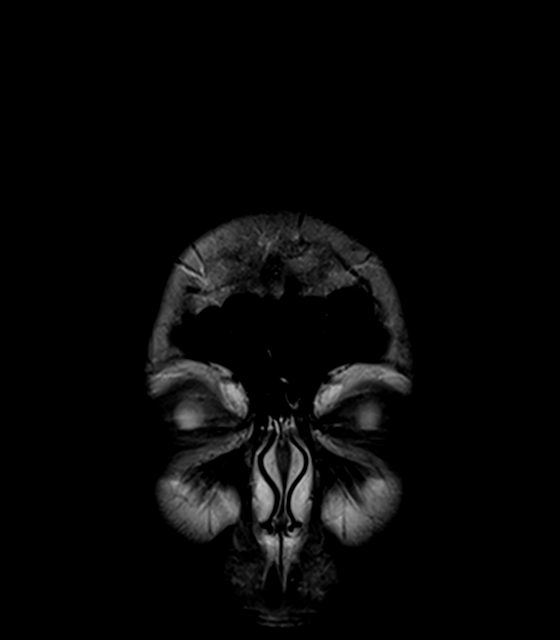

[48 of 48 positions shown; findings below may reference images not displayed]

FINDINGS: Brain: There is no evidence of acute infarct, intracranial
hemorrhage, mass, midline shift, or extra-axial fluid collection.
The ventricles and sulci are within normal limits for age. A few
punctate foci of T2 hyperintensity in the cerebral white matter are
nonspecific and also within normal limits for age.

Vascular: Major intracranial vascular flow voids are preserved.

Skull and upper cervical spine: Unremarkable bone marrow signal para

Sinuses/Orbits: Unremarkable orbits. Paranasal sinuses and mastoid
air cells are clear.

Other: None.
IMPRESSION: Unremarkable appearance of the brain for age.

## 2021-04-24 ENCOUNTER — Encounter: Payer: Self-pay | Admitting: Family Medicine

## 2021-04-24 DIAGNOSIS — Z23 Encounter for immunization: Secondary | ICD-10-CM | POA: Diagnosis not present

## 2021-04-25 NOTE — Telephone Encounter (Signed)
Immunization recorded in the chart.  Dm/cma

## 2021-05-09 ENCOUNTER — Ambulatory Visit (INDEPENDENT_AMBULATORY_CARE_PROVIDER_SITE_OTHER): Payer: Medicare Other

## 2021-05-09 ENCOUNTER — Other Ambulatory Visit: Payer: Self-pay

## 2021-05-09 DIAGNOSIS — Z0000 Encounter for general adult medical examination without abnormal findings: Secondary | ICD-10-CM | POA: Diagnosis not present

## 2021-05-09 NOTE — Progress Notes (Signed)
Virtual Visit via Telephone Note  I connected with  Joanne Brock on 05/09/21 at 11:00 AM EST by telephone and verified that I am speaking with the correct person using two identifiers.  Medicare Annual Wellness visit completed telephonically due to Covid-19 pandemic.   Persons participating in this call: This Health Coach and this patient and Husband Joanne Brock   Location: Patient: Home  Provider: Office    I discussed the limitations, risks, security and privacy concerns of performing an evaluation and management service by telephone and the availability of in person appointments. The patient expressed understanding and agreed to proceed.  Unable to perform video visit due to video visit attempted and failed and/or patient does not have video capability.   Some vital signs may be absent or patient reported.   Willette Brace, LPN   Subjective:   Joanne Brock is a 70 y.o. female who presents for Medicare Annual (Subsequent) preventive examination.  Review of Systems     Cardiac Risk Factors include: advanced age (>63mn, >>22women);dyslipidemia     Objective:    There were no vitals filed for this visit. There is no height or weight on file to calculate BMI.  Advanced Directives 05/09/2021 04/26/2020 03/09/2020 01/27/2020  Does Patient Have a Medical Advance Directive? Yes Yes Yes Yes  Type of AParamedicof ABig SandyLiving will HPen ArgylLiving will;Out of facility DNR (pink MOST or yellow form) HCape CanaveralOut of facility DNR (pink MOST or yellow form);Living will  Copy of HThermalitoin Chart? No - copy requested No - copy requested - -    Current Medications (verified) Outpatient Encounter Medications as of 05/09/2021  Medication Sig   Multiple Vitamin (MULTIVITAMIN) capsule Take 1 capsule by mouth daily.   ondansetron (ZOFRAN ODT) 4 MG disintegrating tablet Take 1  tablet (4 mg total) by mouth every 8 (eight) hours as needed for nausea or vomiting.   rizatriptan (MAXALT) 10 MG tablet TAKE 1 TABLET BY MOUTH AS NEEDED FOR MIGRAINE. MAY REPEAT IN 2 HOURS IF NEEDED   No facility-administered encounter medications on file as of 05/09/2021.    Allergies (verified) Patient has no known allergies.   History: Past Medical History:  Diagnosis Date   Allergy    Cancer ([place]    basal cell and squamous cell skin cancer   History of skin cancer    squamous and basal x 8 in 2018. Dr. HRenda Rolls Had been with Dr. GTonia Brooms    Kidney stone    x1   Migraines    Aura- smell, spots. once a month- sumatriptan. topamax 527monce a day prophylaxis.    Osteopenia    Past Surgical History:  Procedure Laterality Date   COLONOSCOPY     TONSILLECTOMY Bilateral 1960   Family History  Problem Relation Age of Onset   Hypertension Mother    Stroke Mother        TIAs- declined over years and stroke eventually. died 8570 Other Father        decline after ? heat stroke. died around 8068 Stroke Sister        6924nonsmoker.    Ulcerative colitis Daughter    Colon cancer Neg Hx    Colon polyps Neg Hx    Esophageal cancer Neg Hx    Stomach cancer Neg Hx    Rectal cancer Neg Hx    Social History  Socioeconomic History   Marital status: Married    Spouse name: Not on file   Number of children: Not on file   Years of education: Not on file   Highest education level: Not on file  Occupational History   Occupation: retired  Tobacco Use   Smoking status: Never   Smokeless tobacco: Never  Vaping Use   Vaping Use: Never used  Substance and Sexual Activity   Alcohol use: No    Alcohol/week: 0.0 standard drinks   Drug use: No   Sexual activity: Yes  Other Topics Concern   Not on file  Social History Narrative   Married over 4 years in 2018. 1 daughter 4 years old in 2018. No grandkids- 2 grandpets.    Had to spend great deal of time caring for aging  parents- now passed.       Undergrad at [employer].    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 [place]: travel   Right Handed   One Story Home   Does drink caffeine occasionally   Social Determinants of Health   Financial Resource Strain: Low Risk    Difficulty of Paying Living Expenses: Not hard at all  Food Insecurity: No Food Insecurity   Worried About Charity fundraiser in the Last Year: Never true   Ran Out of Food in the Last Year: Never true  Transportation Needs: No Transportation Needs   Lack of Transportation (Medical): No   Lack of Transportation (Non-Medical): No  Physical Activity: Sufficiently Active   Days of Exercise per Week: 5 days   Minutes of Exercise per Session: 50 min  Stress: No Stress Concern Present   Feeling of Stress : Not at all  Social Connections: Moderately Integrated   Frequency of Communication with Friends and Family: Once a week   Frequency of Social Gatherings with Friends and Family: More than three times a week   Attends Religious Services: More than 4 times per year   Active Member of [employer] or Organizations: No   Attends Music therapist: Never   Marital Status: Married    Tobacco Counseling Counseling given: Not Answered   Clinical Intake:  Pre-visit preparation completed: Yes  Pain : No/denies pain     BMI - recorded: 18.96 Nutritional Status: BMI <19  Underweight Nutritional Risks: None Diabetes: No  How often do you need to have someone help you when you read instructions, pamphlets, or other written materials from your doctor or pharmacy?: 1 - Never  Diabetic?No  Interpreter Needed?: No  Information entered by :: Charlott Rakes, LPN   Activities of Daily Living In your present state of health, do you have any difficulty performing the following activities: 05/09/2021  Hearing? N  Vision? N  Difficulty concentrating or making decisions? N  Walking or climbing stairs? N   Dressing or bathing? N  Doing errands, shopping? N  Preparing Food and eating ? N  Using the Toilet? N  In the past six months, have you accidently leaked urine? N  Do you have problems with loss of bowel control? N  Managing your Medications? N  Managing your Finances? N  Housekeeping or managing your Housekeeping? N  Some recent data might be hidden    Patient Care Team: Marin Olp, MD as PCP - General (Family Medicine) [place], Jennefer Bravo, MD as Referring Physician (Dermatology) Paula Compton, MD as Consulting Physician (Obstetrics and Gynecology) Marilynne Halsted, MD  as Referring Physician (Ophthalmology) Cameron Sprang, MD as Consulting Physician (Neurology)  Indicate any recent Medical Services you may have received from other than [facility] providers in the past year (date may be approximate).     Assessment:   This is a routine wellness examination for Johnnette.  Hearing/Vision screen Hearing Screening - Comments:: Pt denies any hearing issues  Vision Screening - Comments:: Pt follows up with [facility] for annual eye exams  Dietary issues and exercise activities discussed: Current Exercise Habits: Home exercise routine, Type of exercise: walking, Time (Minutes): 50, Frequency (Times/Week): 5, Weekly Exercise (Minutes/Week): 250   Goals Addressed             This Visit's Progress    Patient Stated       None at this time        Depression Screen PHQ 2/9 Scores 05/09/2021 07/20/2020 04/26/2020 04/23/2020 10/21/2019 06/27/2019 04/23/2019  PHQ - 2 Score 0 0 0 0 0 3 0  PHQ- 9 Score - - - 0 - 12 0    Fall Risk Fall Risk  05/09/2021 04/26/2020 03/09/2020 01/27/2020 04/23/2019  Falls in the past year? 0 0 1 0 0  Number falls in past yr: 0 0 0 0 0  Injury with Fall? 0 0 1 0 0  Risk for fall due to : Impaired vision Impaired vision - - -  Follow up Falls prevention discussed Falls prevention discussed - - -    FALL RISK PREVENTION  PERTAINING TO THE HOME:  Any stairs in or around the home? No  If so, are there any without handrails? No  Home free of loose throw rugs in walkways, pet beds, electrical cords, etc? Yes  Adequate lighting in your home to reduce risk of falls? Yes   ASSISTIVE DEVICES UTILIZED TO PREVENT FALLS:  Life alert? Yes  Use of a cane, walker or w/c? No  Grab bars in the bathroom? Yes  Shower chair or bench in shower? No  Elevated toilet seat or a handicapped toilet? No   TIMED UP AND GO:  Was the test performed? No .   Cognitive Function: MMSE - Mini Mental State Exam 10/21/2019  Orientation to time 2  Orientation to Place 4  Registration 3  Attention/ Calculation 3  Recall 3  Language- name 2 objects 1  Language- repeat 1  Language- follow 3 step command 3  Language- read & follow direction 1  Write a sentence 1  Copy design 1  Total score 23   Montreal Cognitive Assessment  02/25/2020  Visuospatial/ Executive (0/5) 2  Naming (0/3) 2  Attention: Read list of digits (0/2) 1  Attention: Read list of letters (0/1) 1  Attention: Serial 7 subtraction starting at 100 (0/3) 0  Language: Repeat phrase (0/2) 2  Language : Fluency (0/1) 0  Abstraction (0/2) 1  Delayed Recall (0/5) 0  Orientation (0/6) 5  Total 14  Adjusted Score (based on education) 14   6CIT Screen 05/09/2021  What Year? 4 points  What month? 3 points  What time? 3 points  Count back from 20 0 points  Months in reverse 4 points  Repeat phrase 10 points  Total Score 24    Immunizations Immunization History  Administered Date(s) Administered   Fluad Quad(high Dose 65+) 03/15/2020   Hepatitis A 11/05/2009   Influenza, High Dose Seasonal PF 03/19/2018, 03/24/2019, 03/11/2021   Influenza,inj,Quad PF,6+ Mos 04/03/2017   Influenza-Unspecified 03/03/2009, 03/09/2010, 03/14/2011, 03/19/2012, 03/31/2013, 03/27/2014,  04/02/2015, 04/11/2016   MMR 11/12/2017   PFIZER Comirnaty(Gray Top)Covid-19 Tri-Sucrose Vaccine  10/28/2020   PFIZER(Purple Top)SARS-COV-2 Vaccination 07/01/2019, 07/21/2019, 04/15/2020, 04/25/2021   Pfizer Covid-19 Vaccine Bivalent Booster 51yr & up 04/24/2021   Pneumococcal Conjugate-13 11/12/2017   Pneumococcal Polysaccharide-23 04/23/2019   Tdap 06/12/2012   Zoster Recombinat (Shingrix) 04/29/2019, 09/30/2019   Zoster, Live 03/20/2013    TDAP status: Up to date  Flu Vaccine status: Up to date  Pneumococcal vaccine status: Up to date  Covid-19 vaccine status: Completed vaccines  Qualifies for Shingles Vaccine? Yes   Zostavax completed Yes   Shingrix Completed?: Yes  Screening Tests Health Maintenance  Topic Date Due   MAMMOGRAM  05/31/2021   TETANUS/TDAP  06/12/2022   COLONOSCOPY (Pts 45-457yrInsurance coverage will need to be confirmed)  05/22/2028   Pneumonia Vaccine 6565Years old  Completed   INFLUENZA VACCINE  Completed   DEXA SCAN  Completed   COVID-19 Vaccine  Completed   Hepatitis C Screening  Completed   Zoster Vaccines- Shingrix  Completed   HPV VACCINES  Aged Out    Health Maintenance  There are no preventive care reminders to display for this patient.  Colorectal cancer screening: Type of screening: Colonoscopy. Completed 05/22/18. Repeat every 10 years  Mammogram status: Completed 05/31/20. Repeat every year  Bone Density status: Completed 11/12/19. Results reflect: Bone density results: OSTEOPENIA. Repeat every 2 years.   Additional Screening:  Hepatitis C Screening: Completed 04/16/17  Vision Screening: Recommended annual ophthalmology exams for early detection of glaucoma and other disorders of the eye. Is the patient up to date with their annual eye exam?  Yes  Who is the provider or what is the name of the office in which the patient attends annual eye exams? [facility]  If pt is not established with a provider, would they like to be referred to a provider to establish care? No .   Dental Screening: Recommended annual dental exams  for proper oral hygiene  Community Resource Referral / Chronic Care Management: CRR required this visit?  No   CCM required this visit?  No      Plan:     I have personally reviewed and noted the following in the patient's chart:   Medical and social history Use of alcohol, tobacco or illicit drugs  Current medications and supplements including opioid prescriptions.  Functional ability and status Nutritional status Physical activity Advanced directives List of other physicians Hospitalizations, surgeries, and ER visits in previous 12 months Vitals Screenings to include cognitive, depression, and falls Referrals and appointments  In addition, I have reviewed and discussed with patient certain preventive protocols, quality metrics, and best practice recommendations. A written personalized care plan for preventive services as well as general preventive health recommendations were provided to patient.     TiWillette BraceLPN   [id number] Nurse Notes:Please note that 6CIT was not passed at this AWV . Pt declined last AWV . Pt Husband ThMarcello Mooresequest that he can pick up a paper prescription of the Rizatriptan (maxalt) 10/mg. Related to they have moved and he is unsure of the RX he will be using. He stated to let him know via my chart when and if he can pick it up. He stated he has some at this moment but was planning ahead. Please adArnoldo Hooker

## 2021-05-09 NOTE — Patient Instructions (Signed)
Joanne Brock , Thank you for taking time to come for your Medicare Wellness Visit. I appreciate your ongoing commitment to your health goals. Please review the following plan we discussed and let me know if I can assist you in the future.   Screening recommendations/referrals: Colonoscopy: Done 05/22/18 repeat every 10 years  Mammogram: Done 05/31/20 repeat every year Bone Density: Done 11/12/19 repeat every 2 years  Recommended yearly ophthalmology/optometry visit for glaucoma screening and checkup Recommended yearly dental visit for hygiene and checkup  Vaccinations: Influenza vaccine: Done 03/11/21 repeat every year Pneumococcal vaccine: Up to date Tdap vaccine: Done 03/20/13 repeat every 10 years  Shingles vaccine: Done 04/29/19 & 09/30/19   Covid-19:Completed 1/19, 2/8, 04/15/20 & 04/24/21, 04/25/21  Advanced directives: Please bring a copy of your health care power of attorney and living will to the office at your convenience.  Conditions/risks identified: None at this time  Next appointment: Follow up in one year for your annual wellness visit    Preventive Care 65 Years and Older, Female Preventive care refers to lifestyle choices and visits with your health care provider that can promote health and wellness. What does preventive care include? A yearly physical exam. This is also called an annual well check. Dental exams once or twice a year. Routine eye exams. Ask your health care provider how often you should have your eyes checked. Personal lifestyle choices, including: Daily care of your teeth and gums. Regular physical activity. Eating a healthy diet. Avoiding tobacco and drug use. Limiting alcohol use. Practicing safe sex. Taking low-dose aspirin every day. Taking vitamin and mineral supplements as recommended by your health care provider. What happens during an annual well check? The services and screenings done by your health care provider during your annual well  check will depend on your age, overall health, lifestyle risk factors, and family history of disease. Counseling  Your health care provider may ask you questions about your: Alcohol use. Tobacco use. Drug use. Emotional well-being. Home and relationship well-being. Sexual activity. Eating habits. History of falls. Memory and ability to understand (cognition). Work and work Statistician. Reproductive health. Screening  You may have the following tests or measurements: Height, weight, and BMI. Blood pressure. Lipid and cholesterol levels. These may be checked every 5 years, or more frequently if you are over 79 years old. Skin check. Lung cancer screening. You may have this screening every year starting at age 7 if you have a 30-pack-year history of smoking and currently smoke or have quit within the past 15 years. Fecal occult blood test (FOBT) of the stool. You may have this test every year starting at age 82. Flexible sigmoidoscopy or colonoscopy. You may have a sigmoidoscopy every 5 years or a colonoscopy every 10 years starting at age 52. Hepatitis C blood test. Hepatitis B blood test. Sexually transmitted disease (STD) testing. Diabetes screening. This is done by checking your blood sugar (glucose) after you have not eaten for a while (fasting). You may have this done every 1-3 years. Bone density scan. This is done to screen for osteoporosis. You may have this done starting at age 1. Mammogram. This may be done every 1-2 years. Talk to your health care provider about how often you should have regular mammograms. Talk with your health care provider about your test results, treatment options, and if necessary, the need for more tests. Vaccines  Your health care provider may recommend certain vaccines, such as: Influenza vaccine. This is recommended every year. Tetanus, diphtheria,  and acellular pertussis (Tdap, Td) vaccine. You may need a Td booster every 10 years. Zoster  vaccine. You may need this after age 41. Pneumococcal 13-valent conjugate (PCV13) vaccine. One dose is recommended after age 53. Pneumococcal polysaccharide (PPSV23) vaccine. One dose is recommended after age 18. Talk to your health care provider about which screenings and vaccines you need and how often you need them. This information is not intended to replace advice given to you by your health care provider. Make sure you discuss any questions you have with your health care provider. Document Released: 06/25/2015 Document Revised: 02/16/2016 Document Reviewed: 03/30/2015 Elsevier Interactive Patient Education  2017 [employer] Prevention in the Home Falls can cause injuries. They can happen to people of all ages. There are many things you can do to make your home safe and to help prevent falls. What can I do on the outside of my home? Regularly fix the edges of walkways and driveways and fix any cracks. Remove anything that might make you trip as you walk through a door, such as a raised step or threshold. Trim any bushes or trees on the path to your home. Use bright outdoor lighting. Clear any walking paths of anything that might make someone trip, such as rocks or tools. Regularly check to see if handrails are loose or broken. Make sure that both sides of any steps have handrails. Any raised decks and porches should have guardrails on the edges. Have any leaves, snow, or ice cleared regularly. Use sand or salt on walking paths during winter. Clean up any spills in your garage right away. This includes oil or grease spills. What can I do in the bathroom? Use night lights. Install grab bars by the toilet and in the tub and shower. Do not use towel bars as grab bars. Use non-skid mats or decals in the tub or shower. If you need to sit down in the shower, use a plastic, non-slip stool. Keep the floor dry. Clean up any water that spills on the floor as soon as it happens. Remove  soap buildup in the tub or shower regularly. Attach bath mats securely with double-sided non-slip rug tape. Do not have throw rugs and other things on the floor that can make you trip. What can I do in the bedroom? Use night lights. Make sure that you have a light by your bed that is easy to reach. Do not use any sheets or blankets that are too big for your bed. They should not hang down onto the floor. Have a firm chair that has side arms. You can use this for support while you get dressed. Do not have throw rugs and other things on the floor that can make you trip. What can I do in the kitchen? Clean up any spills right away. Avoid walking on wet floors. Keep items that you use a lot in easy-to-reach places. If you need to reach something above you, use a strong step stool that has a grab bar. Keep electrical cords out of the way. Do not use floor polish or wax that makes floors slippery. If you must use wax, use non-skid floor wax. Do not have throw rugs and other things on the floor that can make you trip. What can I do with my stairs? Do not leave any items on the stairs. Make sure that there are handrails on both sides of the stairs and use them. Fix handrails that are broken or loose. Make sure  that handrails are as long as the stairways. Check any carpeting to make sure that it is firmly attached to the stairs. Fix any carpet that is loose or worn. Avoid having throw rugs at the top or bottom of the stairs. If you do have throw rugs, attach them to the floor with carpet tape. Make sure that you have a light switch at the top of the stairs and the bottom of the stairs. If you do not have them, ask someone to add them for you. What else can I do to help prevent falls? Wear shoes that: Do not have high heels. Have rubber bottoms. Are comfortable and fit you well. Are closed at the toe. Do not wear sandals. If you use a stepladder: Make sure that it is fully opened. Do not climb a  closed stepladder. Make sure that both sides of the stepladder are locked into place. Ask someone to hold it for you, if possible. Clearly mark and make sure that you can see: Any grab bars or handrails. First and last steps. Where the edge of each step is. Use tools that help you move around (mobility aids) if they are needed. These include: Canes. Walkers. Scooters. Crutches. Turn on the lights when you go into a dark area. Replace any light bulbs as soon as they burn out. Set up your furniture so you have a clear path. Avoid moving your furniture around. If any of your floors are uneven, fix them. If there are any pets around you, be aware of where they are. Review your medicines with your doctor. Some medicines can make you feel dizzy. This can increase your chance of falling. Ask your doctor what other things that you can do to help prevent falls. This information is not intended to replace advice given to you by your health care provider. Make sure you discuss any questions you have with your health care provider. Document Released: 03/25/2009 Document Revised: 11/04/2015 Document Reviewed: 07/03/2014 Elsevier Interactive Patient Education  2017 [employer].

## 2021-05-10 MED ORDER — RIZATRIPTAN BENZOATE 10 MG PO TABS: ORAL_TABLET | ORAL | 11 refills | Status: DC

## 2021-05-10 NOTE — Addendum Note (Signed)
Addended by: Marin Olp on: 05/10/2021 11:40 AM   Modules accepted: Orders

## 2021-05-15 ENCOUNTER — Other Ambulatory Visit: Payer: Self-pay | Admitting: Family Medicine

## 2021-05-21 ENCOUNTER — Other Ambulatory Visit: Payer: Self-pay | Admitting: Family Medicine

## 2021-08-10 DIAGNOSIS — H2513 Age-related nuclear cataract, bilateral: Secondary | ICD-10-CM | POA: Diagnosis not present

## 2021-08-10 DIAGNOSIS — L718 Other rosacea: Secondary | ICD-10-CM | POA: Diagnosis not present

## 2021-08-10 DIAGNOSIS — H18453 Nodular corneal degeneration, bilateral: Secondary | ICD-10-CM | POA: Diagnosis not present

## 2021-09-26 DIAGNOSIS — L309 Dermatitis, unspecified: Secondary | ICD-10-CM | POA: Diagnosis not present

## 2021-09-26 DIAGNOSIS — L821 Other seborrheic keratosis: Secondary | ICD-10-CM | POA: Diagnosis not present

## 2021-09-26 DIAGNOSIS — Z85828 Personal history of other malignant neoplasm of skin: Secondary | ICD-10-CM | POA: Diagnosis not present

## 2021-09-26 DIAGNOSIS — L814 Other melanin hyperpigmentation: Secondary | ICD-10-CM | POA: Diagnosis not present

## 2021-09-26 DIAGNOSIS — Z808 Family history of malignant neoplasm of other organs or systems: Secondary | ICD-10-CM | POA: Diagnosis not present

## 2021-09-26 DIAGNOSIS — Z86018 Personal history of other benign neoplasm: Secondary | ICD-10-CM | POA: Diagnosis not present

## 2021-09-26 DIAGNOSIS — L578 Other skin changes due to chronic exposure to nonionizing radiation: Secondary | ICD-10-CM | POA: Diagnosis not present

## 2021-09-26 DIAGNOSIS — D225 Melanocytic nevi of trunk: Secondary | ICD-10-CM | POA: Diagnosis not present

## 2021-12-01 DIAGNOSIS — Z01419 Encounter for gynecological examination (general) (routine) without abnormal findings: Secondary | ICD-10-CM | POA: Diagnosis not present

## 2021-12-08 ENCOUNTER — Other Ambulatory Visit: Payer: Self-pay | Admitting: Family Medicine

## 2021-12-18 ENCOUNTER — Encounter: Payer: Self-pay | Admitting: Family Medicine

## 2021-12-20 ENCOUNTER — Ambulatory Visit (INDEPENDENT_AMBULATORY_CARE_PROVIDER_SITE_OTHER): Payer: Medicare Other | Admitting: Family Medicine

## 2021-12-20 ENCOUNTER — Encounter: Payer: Self-pay | Admitting: Family Medicine

## 2021-12-20 ENCOUNTER — Other Ambulatory Visit: Payer: Self-pay | Admitting: Family Medicine

## 2021-12-20 VITALS — BP 100/64 | HR 49 | Temp 98.3°F | Ht 63.0 in | Wt 106.8 lb

## 2021-12-20 DIAGNOSIS — M85851 Other specified disorders of bone density and structure, right thigh: Secondary | ICD-10-CM

## 2021-12-20 DIAGNOSIS — E785 Hyperlipidemia, unspecified: Secondary | ICD-10-CM

## 2021-12-20 DIAGNOSIS — G43109 Migraine with aura, not intractable, without status migrainosus: Secondary | ICD-10-CM | POA: Diagnosis not present

## 2021-12-20 DIAGNOSIS — G301 Alzheimer's disease with late onset: Secondary | ICD-10-CM | POA: Diagnosis not present

## 2021-12-20 DIAGNOSIS — E559 Vitamin D deficiency, unspecified: Secondary | ICD-10-CM | POA: Diagnosis not present

## 2021-12-20 DIAGNOSIS — F0280 Dementia in other diseases classified elsewhere without behavioral disturbance: Secondary | ICD-10-CM

## 2021-12-20 LAB — CBC WITH DIFFERENTIAL/PLATELET
Basophils Absolute: 0 10*3/uL (ref 0.0–0.1)
Basophils Relative: 0.2 % (ref 0.0–3.0)
Eosinophils Absolute: 0 10*3/uL (ref 0.0–0.7)
Eosinophils Relative: 0.2 % (ref 0.0–5.0)
HCT: 37.1 % (ref 36.0–46.0)
Hemoglobin: 12.4 g/dL (ref 12.0–15.0)
Lymphocytes Relative: 25.3 % (ref 12.0–46.0)
Lymphs Abs: 1.2 10*3/uL (ref 0.7–4.0)
MCHC: 33.5 g/dL (ref 30.0–36.0)
MCV: 86.5 fl (ref 78.0–100.0)
Monocytes Absolute: 0.4 10*3/uL (ref 0.1–1.0)
Monocytes Relative: 7.8 % (ref 3.0–12.0)
Neutro Abs: 3.1 10*3/uL (ref 1.4–7.7)
Neutrophils Relative %: 66.5 % (ref 43.0–77.0)
Platelets: 221 10*3/uL (ref 150.0–400.0)
RBC: 4.29 Mil/uL (ref 3.87–5.11)
RDW: 12.9 % (ref 11.5–15.5)
WBC: 4.6 10*3/uL (ref 4.0–10.5)

## 2021-12-20 LAB — COMPREHENSIVE METABOLIC PANEL
ALT: 6 U/L (ref 0–35)
AST: 16 U/L (ref 0–37)
Albumin: 4.3 g/dL (ref 3.5–5.2)
Alkaline Phosphatase: 74 U/L (ref 39–117)
BUN: 11 mg/dL (ref 6–23)
CO2: 29 mEq/L (ref 19–32)
Calcium: 9.3 mg/dL (ref 8.4–10.5)
Chloride: 103 mEq/L (ref 96–112)
Creatinine, Ser: 0.91 mg/dL (ref 0.40–1.20)
GFR: 64.45 mL/min (ref >60.00)
Glucose, Bld: 88 mg/dL (ref 70–99)
Potassium: 3.9 mEq/L (ref 3.5–5.1)
Sodium: 139 mEq/L (ref 135–145)
Total Bilirubin: 0.4 mg/dL (ref 0.2–1.2)
Total Protein: 6.7 g/dL (ref 6.0–8.3)

## 2021-12-20 LAB — LIPID PANEL
Cholesterol: 242 mg/dL — ABNORMAL HIGH (ref 0–200)
HDL: 59.8 mg/dL (ref >39.00)
LDL Cholesterol: 158 mg/dL — ABNORMAL HIGH (ref 0–99)
NonHDL: 182.66
Total CHOL/HDL Ratio: 4
Triglycerides: 125 mg/dL (ref 0.0–149.0)
VLDL: 25 mg/dL (ref 0.0–40.0)

## 2021-12-20 LAB — VITAMIN D 25 HYDROXY (VIT D DEFICIENCY, FRACTURES): VITD: 22.15 ng/mL — ABNORMAL LOW (ref 30.00–100.00)

## 2021-12-20 MED ORDER — VITAMIN D (ERGOCALCIFEROL) 1.25 MG (50000 UNIT) PO CAPS: 50000.0000 [IU] | ORAL_CAPSULE | ORAL | 1 refills | Status: DC

## 2021-12-20 NOTE — Patient Instructions (Addendum)
Health Maintenance Due  Topic Date Due   MAMMOGRAM -have results sent [place] when you have it done next month [phone]. 05/31/2021   [facility] for bone density Schedule an appointment by calling [phone].  Schedule follow-up with Dr. Lajoyce Lauber [place] know if you need a new referral.  Please stop by lab before you go If you have mychart- we will send your results within 3 business days of [place] receiving them.  If you do not have mychart- we will call you about results within 5 business days of [place] receiving them.  *please also note that you will see labs on mychart as soon as they post. I will later go in and write notes on them- will say "notes from Dr. Yong Channel"   Recommended follow up: Return in about 1 year (around 12/21/2022) for followup or sooner if needed.Schedule b4 you leave.

## 2021-12-20 NOTE — Progress Notes (Signed)
Phone [phone] In person visit   Subjective:   Joanne Brock is a 72 y.o. year old very pleasant female patient who presents for/with See problem oriented charting Chief Complaint  Patient presents with   Annual Exam    Not fasting.   Hyperlipidemia   stomach upset    Pt c/o stomach upset frequently after meals.    allzehimers    Would like to discuss new alzheimer's drug Lequmbi.   Past Medical History-  Patient Active Problem List   Diagnosis Date Noted   Late onset Alzheimer's disease without behavioral disturbance ([place]) 03/04/2020    Priority: High   Osteopenia 04/16/2017    Priority: Medium    History of adenomatous polyp of colon 02/20/2017    Priority: Medium    Hyperlipidemia 02/08/2017    Priority: Medium    Migraines     Priority: Medium    Morton neuroma, left 10/02/2017    Priority: Low   Vitamin D deficiency 02/20/2017    Priority: Low   History of skin cancer     Priority: Low    Medications- reviewed and updated Current Outpatient Medications  Medication Sig Dispense Refill   Multiple Vitamin (MULTIVITAMIN) capsule Take 1 capsule by mouth daily.     ondansetron (ZOFRAN-ODT) 4 MG disintegrating tablet TAKE 1 TABLET BY MOUTH EVERY 8 HOURS AS NEEDED FOR NAUSEA OR VOMITTING 30 tablet 1   rizatriptan (MAXALT) 10 MG tablet TAKE 1 TABLET BY MOUTH AS NEEDED FOR MIGRAINE. MAY REPEAT IN 2 HOURS IF NEEDED 10 tablet 11   No current facility-administered medications for this visit.     Objective:  BP 100/64   Pulse (!) 49   Temp 98.3 F (36.8 C)   Ht '5\' 3"'$  (1.6 m)   Wt 106 lb 12.8 oz (48.4 kg)   SpO2 99%   BMI 18.92 kg/m  Gen: NAD, resting comfortably CV: RRR no murmurs rubs or gallops Lungs: CTAB no crackles, wheeze, rhonchi Abdomen: soft/nontender/nondistended/normal bowel sounds. No rebound or guarding.  Ext: no edema Skin: warm, dry Neuro: grossly normal, moves all extremities     Assessment and Plan   # Alzheimer's dementia S:  Medication: None -Had significant issues on donepezil with GI upset/diarrhea-despite stopping medication has continued to have stomach upset frequently after meals-diarrhea diminished but still gets stomach upset  -Has Zofran available- helps about 20 minutes later  - Dr. Delice Lesch had mentioned-can switch to Rivastigmine 1.'5mg'$  BID if no improvement -Has questions about new Alzheimer's medication Leqembi -Neurology follow-up increases anxiety and has preferred to hold off if possible with last visit September 2021 A/P: alzheimers noted- mentioned return to Dr. Delice Lesch to consider Leqembi vs rivastigmine (lean toward the later) and they would like to pursue this -stomach issues treated well with ondansetron- can continue this.  -has also noted tremor in left hand with activity- no resting tremor- also seems to be worse with anxiety. Sounds like essential tremor. Father had parkinsons but was told medially induced.   # Migraines S: Medication: Rizatriptan 10 mg as needed-typically using twice a month down from twice weekly  A/P: thankful for improvement- no clear trigger for improvement- continue to monitor.    #hyperlipidemia S: Medication:none The 10-year ASCVD risk score (Arnett DK, et al., 2019) is: 5.4%  Lab Results  Component Value Date   CHOL 238 (H) 04/23/2020   HDL 66 04/23/2020   LDLCALC 151 (H) 04/23/2020   TRIG 97 04/23/2020   CHOLHDL 3.6 04/23/2020   A/P:  update lipid panel- likely only consider statin if risk gets above 7.5% and even then would be cautious with memory as well as consider ct cardiac scoring.   # Low Bone density (formerly osteopenia) S: Last DEXA: 11/12/19  Calcium: '1200mg'$  (through diet ok) recommended - 1 a day MV for women over 55 Vitamin D: 1000 units a day recommended-see below- some reluctance A/P: 2 years from last bone density- we will order update- would love for consistency with MV if possible- may need to verify doses of calcium/vitamin D if bone density  worsening and if osteoporosis range may need to even consider medication   #Vitamin D deficiency S: Medication: has not been as consistent Last vitamin D Lab Results  Component Value Date   VD25OH 58.01 07/20/2020  A/P: update bone density as well as vitamin d- she prefers not to take unless has to/low levels   Recommended follow up: Return in about 1 year (around 12/21/2022) for followup or sooner if needed.Schedule b4 you leave. Future Appointments  Date Time Provider [place]  05/22/2022 11:00 AM [facility]   Lab/Order associations: Not fasting   ICD-10-CM   1. Hyperlipidemia, unspecified hyperlipidemia type  E78.5 CBC with Differential/Platelet    Comprehensive metabolic panel    Lipid panel    2. Vitamin D deficiency  E55.9 VITAMIN D 25 Hydroxy (Vit-D Deficiency, Fractures)    3. Late onset Alzheimer's disease without behavioral disturbance (HCC)  G30.1    F02.80     4. Migraine with aura and without status migrainosus, not intractable  G43.109     5. Osteopenia of neck of right femur  M85.851 DG Bone Density      No orders of the defined types were placed in this encounter.   Return precautions advised.  Garret Reddish, MD

## 2021-12-27 ENCOUNTER — Encounter: Payer: Self-pay | Admitting: Family Medicine

## 2021-12-27 ENCOUNTER — Ambulatory Visit (INDEPENDENT_AMBULATORY_CARE_PROVIDER_SITE_OTHER): Payer: Medicare Other | Admitting: Neurology

## 2021-12-27 ENCOUNTER — Encounter: Payer: Self-pay | Admitting: Neurology

## 2021-12-27 VITALS — BP 132/61 | HR 89 | Ht 64.0 in | Wt 103.0 lb

## 2021-12-27 DIAGNOSIS — G301 Alzheimer's disease with late onset: Secondary | ICD-10-CM | POA: Diagnosis not present

## 2021-12-27 DIAGNOSIS — F0280 Dementia in other diseases classified elsewhere without behavioral disturbance: Secondary | ICD-10-CM | POA: Diagnosis not present

## 2021-12-27 MED ORDER — MEMANTINE HCL 10 MG PO TABS: 10.0000 mg | ORAL_TABLET | Freq: Two times a day (BID) | ORAL | 11 refills | Status: DC

## 2021-12-27 NOTE — Patient Instructions (Addendum)
Good to see you.  Start Memantine (Namenda) '10mg'$ : take 1 tablet every night for 2 weeks, then increase to 1 tablet twice a day  2. Continue with plan to join more activities at [facility]  3. Follow-up in 6 months, cal for any changes   FALL PRECAUTIONS: Be cautious when walking. Scan the area for obstacles that may increase the risk of trips and falls. When getting up in the mornings, sit up at the edge of the bed for a few minutes before getting out of bed. Consider elevating the bed at the head end to avoid drop of blood pressure when getting up. Walk always in a well-lit room (use night lights in the walls). Avoid area rugs or power cords from appliances in the middle of the walkways. Use a walker or a cane if necessary and consider physical therapy for balance exercise. Get your eyesight checked regularly.   HOME SAFETY: Consider the safety of the kitchen when operating appliances like stoves, microwave oven, and blender. Consider having supervision and share cooking responsibilities until no longer able to participate in those. Accidents with firearms and other hazards in the house should be identified and addressed as well.   ABILITY TO BE LEFT ALONE: If patient is unable to contact 911 operator, consider using LifeLine, or when the need is there, arrange for someone to stay with patients. Smoking is a fire hazard, consider supervision or cessation. Risk of wandering should be assessed by caregiver and if detected at any point, supervision and safe proof recommendations should be instituted.   RECOMMENDATIONS FOR ALL PATIENTS WITH MEMORY PROBLEMS: 1. Continue to exercise (Recommend 30 minutes of walking everyday, or 3 hours every week) 2. Increase social interactions - continue going to [place] and enjoy social gatherings with friends and family 3. Eat healthy, avoid fried foods and eat more fruits and vegetables 4. Maintain adequate blood pressure, blood sugar, and blood cholesterol  level. Reducing the risk of stroke and cardiovascular disease also helps promoting better memory. 5. Avoid stressful situations. Live a simple life and avoid aggravations. Organize your time and prepare for the next day in anticipation. 6. Sleep well, avoid any interruptions of sleep and avoid any distractions in the bedroom that may interfere with adequate sleep quality 7. Avoid sugar, avoid sweets as there is a strong link between excessive sugar intake, diabetes, and cognitive impairment We discussed the Mediterranean diet, which has been shown to help patients reduce the risk of progressive memory disorders and reduces cardiovascular risk. This includes eating fish, eat fruits and green leafy vegetables, nuts like almonds and hazelnuts, walnuts, and also use olive oil. Avoid fast foods and fried foods as much as possible. Avoid sweets and sugar as sugar use has been linked to worsening of memory function.  There is always a concern of gradual progression of memory problems. If this is the case, then we may need to adjust level of care according to patient needs. Support, both to the patient and caregiver, should then be put into place.

## 2021-12-27 NOTE — Progress Notes (Signed)
NEUROLOGY FOLLOW UP OFFICE NOTE  Joanne Brock [id number] Jan 26, 1951  HISTORY OF PRESENT ILLNESS: I had the pleasure of seeing Joanne Brock in follow-up in the neurology clinic on 12/27/2021. She is again accompanied by her husband who helps supplement the history today. The patient was last seen almost 2 years ago for memory loss. Neuropsychological evaluation in 02/2020 indicated Alzheimer's dementia without behavioral disturbance, MCI level of function. She was started on Donepezil '10mg'$  daily in 2021 and was lost to follow-up. On review of records, she had diarrhea and stomach upset after meals and stopped the Donepezil, with decreased diarrhea but continued stomach upset.  She presents today with note of continued decline. When asked about memory, she states "that may be a problem." She looks to her husband when asked questions. She has occasional word-finding difficulties and has difficulties keeping up with conversations. They moved to [facility] 8 months ago. Her husband took over finances 3 years ago, and started managing medications 2 years ago because she was forgetting them. He has to keep the Rizatriptan and Ondansetron in his pocket, if left alone, she would not know to take it. There is some confusion if she needs to swallow the pill or rest on her tongue. She stopped cooking 2 years ago, forgetting how to do some of the techniques/steps. She stopped driving 6 months ago, there was a problem getting lost but he was always with her, and he was concerned about difficulty processing all the information on the road. She has issues selecting her clothes, but is able to bathe and dress herself, no hygiene concerns. No behavioral changes such as paranoia or hallucinations. She sleeps 10 hours at night but still gets a good amount of sleep during the day.  She states mood is fine.  She has a migraine around once a week with good response to prn Rizatriptan. She rarely needs to take  2. Her husband feels there is a lot of pain when she has the stomach upset after eating, she says she is not sure if she has pain. She denies any diarrhea, her husband is not sure. She denies any dizziness, vision changes, focal numbness/tingling/weakness, no falls.    History on Initial Assessment 01/27/2020: This is a pleasant 71 year old right-handed woman with a history of skin cancer, migraines, nephrolithiasis, presenting for evaluation of memory loss and worsening migraines. She feels like memory issues do not affect her on a regular basis, and started noticing changes a few months ago. Her husband started noticing changes towards the last quarter of 2019. She is a retired Engineer, maintenance (IT) and would be able to do simple math in her head, but started using the calculator more. She started noticing that calculations she could do were not coming easily, now taking her a longer time. She used to do the books in 45 minutes, and started taking 6 hours to complete. Her husband took over 3-4 months ago. She was not missing any payments. She denies getting lost driving, leaving the stove on, misplacing things. She is not on any regular medications, she takes prn Maxalt for migraine rescue. Her husband feels changes have been overall stable since 2019 but noticed she is working around them more. Sometimes she asks the same question, especially with time, repeatedly asking when it is time for them to leave. She has occasional word-finding issues. Her mother had memory loss consistent with dementia. Her father had a dementing illness and drug-induced Parkinson's disease but did not have  memory issues. No history of significant head injuries or alcohol use.  She started having migraines in her 28s, with pressure over the right temporal region majority of the time. Occasionally she has left temporal pain. No associated nausea/vomiting, photo/phonophobia, visual obscurations. She takes prn Maxalt with good effect. She was having  migraines more frequently last May, but this has quieted down, she does not need to take Maxalt every month. She denies any dizziness, diplopia, dysarthria/dysphagia, neck/back pain, focal numbness/tingling/weakness, bowel/bladder dysfunction, anosmia, or tremors. Mood is good. No paranoia or hallucinations, no personality changes. Sleep is good. She retired in January 2018.  I personally reviewed MRI brain without contrast done 11/2019 which did not show any acute changes. There was mild diffuse atrophy and a few punctate foci of T2 hyperintensity, within normal limits for age.   Laboratory Data: Lab Results  Component Value Date   TSH 2.04 10/21/2019   Lab Results  Component Value Date   [id number] 10/21/2019    PAST MEDICAL HISTORY: Past Medical History:  Diagnosis Date   Allergy    Cancer ([place])    basal cell and squamous cell skin cancer   History of skin cancer    squamous and basal x 8 in 2018. Dr. Renda Rolls. Had been with Dr. Tonia Brooms.    Kidney stone    x1   Migraines    Aura- smell, spots. once a month- sumatriptan. topamax '50mg'$  once a day prophylaxis.    Osteopenia     MEDICATIONS: Current Outpatient Medications on File Prior to Visit  Medication Sig Dispense Refill   Multiple Vitamin (MULTIVITAMIN) capsule Take 1 capsule by mouth daily.     ondansetron (ZOFRAN-ODT) 4 MG disintegrating tablet TAKE 1 TABLET BY MOUTH EVERY 8 HOURS AS NEEDED FOR NAUSEA OR VOMITTING 30 tablet 1   rizatriptan (MAXALT) 10 MG tablet TAKE 1 TABLET BY MOUTH AS NEEDED FOR MIGRAINE. MAY REPEAT IN 2 HOURS IF NEEDED 10 tablet 11   Vitamin D, Ergocalciferol, (DRISDOL) 1.25 MG (50000 UNIT) CAPS capsule Take 1 capsule (50,000 Units total) by mouth every 7 (seven) days. (Patient not taking: Reported on 12/27/2021) 13 capsule 1   No current facility-administered medications on file prior to visit.    ALLERGIES: No Known Allergies  FAMILY HISTORY: Family History  Problem Relation Age of Onset    Hypertension Mother    Stroke Mother        TIAs- declined over years and stroke eventually. died 56   Other Father        decline after ? heat stroke. died around 37   Stroke Sister        53. nonsmoker.    Ulcerative colitis Daughter    Colon cancer Neg Hx    Colon polyps Neg Hx    Esophageal cancer Neg Hx    Stomach cancer Neg Hx    Rectal cancer Neg Hx     SOCIAL HISTORY: Social History   Socioeconomic History   Marital status: Married    Spouse name: Not on file   Number of children: Not on file   Years of education: Not on file   Highest education level: Not on file  Occupational History   Occupation: retired  Tobacco Use   Smoking status: Never   Smokeless tobacco: Never  Vaping Use   Vaping Use: Never used  Substance and Sexual Activity   Alcohol use: No    Alcohol/week: 0.0 standard drinks of alcohol   Drug use: No  Sexual activity: Yes  Other Topics Concern   Not on file  Social History Narrative   Married over 40 years in 2018. 1 daughter 39 years old in 2023. 1 granddaughter born June 2023.    Had to spend great deal of time caring for aging parents- now passed.       Undergrad at [employer].    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 [place]: travel   Right Handed   One Story Home   Does drink caffeine occasionally   Social Determinants of Health   Financial Resource Strain: Low Risk  (05/09/2021)   Overall Financial Resource Strain (CARDIA)    Difficulty of Paying Living Expenses: Not hard at all  Food Insecurity: No Food Insecurity (05/09/2021)   Hunger Vital Sign    Worried About Running Out of Food in the Last Year: Never true    Ran Out of Food in the Last Year: Never true  Transportation Needs: No Transportation Needs (05/09/2021)   PRAPARE - Hydrologist (Medical): No    Lack of Transportation (Non-Medical): No  Physical Activity: Sufficiently Active (05/09/2021)   Exercise Vital Sign     Days of Exercise per Week: 5 days    Minutes of Exercise per Session: 50 min  Stress: No Stress Concern Present (05/09/2021)   [place]    Feeling of Stress : Not at all  Social Connections: Moderately Integrated (05/09/2021)   Social Connection and Isolation Panel [NHANES]    Frequency of Communication with Friends and Family: Once a week    Frequency of Social Gatherings with Friends and Family: More than three times a week    Attends Religious Services: More than 4 times per year    Active Member of [employer] or Organizations: No    Attends Archivist Meetings: Never    Marital Status: Married  Human resources officer Violence: Not At Risk (05/09/2021)   Humiliation, Afraid, Rape, and Kick questionnaire    Fear of Current or Ex-Partner: No    Emotionally Abused: No    Physically Abused: No    Sexually Abused: No     PHYSICAL EXAM: Vitals:   12/27/21 0815  BP: 132/61  Pulse: 89  SpO2: 97%   General: No acute distress Head:  Normocephalic/atraumatic Skin/Extremities: No rash, no edema Neurological Exam: alert and oriented to person, city/state, season. No aphasia or dysarthria. Fund of knowledge is reduced. Difficulty with 2-step commands. Recent and remote memory are impaired. Attention and concentration are reduced. Unable to draw intersecting pentagons or a clock. MMSE 9/30.    12/27/2021    8:00 AM 10/21/2019    2:09 PM  MMSE - Mini Mental State Exam  Orientation to time 1 2  Orientation to Place 2 4  Registration 3 3  Attention/ Calculation 0 3  Recall 0 3  Language- name 2 objects 0 1  Language- repeat 1 1  Language- follow 3 step command 1 3  Language- read & follow direction 1 1  Write a sentence 0 1  Copy design 0 1  Total score 9 23   Cranial nerves: Pupils equal, round. Extraocular movements intact with no nystagmus. Visual fields full.  No facial asymmetry.  Motor: Bulk and tone  normal, muscle strength 5/5 throughout with no pronator drift.   Finger to nose testing intact.  Gait narrow-based and steady, no ataxia.  Romberg negative. No resting tremor, minimal postural tremor.   IMPRESSION: This is a pleasant 71 yo RH woman with a history of skin cancer, migraines, nephrolithiasis, migraines, lost to follow-up for 2 years, with memory loss. Neuropsychological evaluation in 2021 indicated Alzheimer's dementia without behavioral disturbance, at that time she was at [facility] level of functioning. There has been progressive decline in the past 2 years, MMSE today 9/30. She had side effects on Donepezil and continues to note stomach issues. We discussed starting Memantine which would be a different medication class from Donepezil, and hopefully she tolerates better. We discussed side effects and expectations from medication, start Memantine '10mg'$  qhs x 2 weeks, then increase to '10mg'$  BID. We had an extensive discussion about Leqembi, she is not a candidate for it due to her stage of the disease, but also discussed the medication and weighing potential benefits and potential side effects. She was encouraged to increase physical activity and join activities at [employer]. Continue close supervision. Follow-up in 6 months, call for any changes.    Thank you for allowing me to participate in her care.  Please do not hesitate to call for any questions or concerns.    Ellouise Newer, M.D.   CC: Dr. Yong Channel

## 2022-01-09 DIAGNOSIS — Z1231 Encounter for screening mammogram for malignant neoplasm of breast: Secondary | ICD-10-CM | POA: Diagnosis not present

## 2022-01-09 LAB — HM MAMMOGRAPHY

## 2022-02-24 ENCOUNTER — Encounter: Payer: Self-pay | Admitting: Physician Assistant

## 2022-02-24 ENCOUNTER — Encounter: Payer: Self-pay | Admitting: Family Medicine

## 2022-02-24 ENCOUNTER — Ambulatory Visit (INDEPENDENT_AMBULATORY_CARE_PROVIDER_SITE_OTHER): Payer: Medicare Other | Admitting: Family Medicine

## 2022-02-24 VITALS — BP 100/70 | HR 75 | Temp 98.0°F | Ht 64.0 in | Wt 104.8 lb

## 2022-02-24 DIAGNOSIS — R1012 Left upper quadrant pain: Secondary | ICD-10-CM | POA: Diagnosis not present

## 2022-02-24 NOTE — Progress Notes (Signed)
Phone [phone] In person visit   Subjective:   Joanne Brock is a 71 y.o. year old very pleasant female patient who presents for/with See problem oriented charting Chief Complaint  Patient presents with   Abdominal Pain    Pt is here to f/u on ongoing abd pain and the frquency is increasing that happens right after eating that radiates to left flank with nausea and cramping.    Past Medical History-  Patient Active Problem List   Diagnosis Date Noted   Late onset Alzheimer's disease without behavioral disturbance ([place]) 03/04/2020    Priority: High   Osteopenia 04/16/2017    Priority: Medium    History of adenomatous polyp of colon 02/20/2017    Priority: Medium    Hyperlipidemia 02/08/2017    Priority: Medium    Migraines     Priority: Medium    Morton neuroma, left 10/02/2017    Priority: Low   Vitamin D deficiency 02/20/2017    Priority: Low   History of skin cancer     Priority: Low    Medications- reviewed and updated Current Outpatient Medications  Medication Sig Dispense Refill   memantine (NAMENDA) 10 MG tablet Take 1 tablet (10 mg total) by mouth 2 (two) times daily. Take 1 tablet every night for 2 weeks, then increase to 1 tablet twice a day 60 tablet 11   Multiple Vitamin (MULTIVITAMIN) capsule Take 1 capsule by mouth daily.     ondansetron (ZOFRAN-ODT) 4 MG disintegrating tablet TAKE 1 TABLET BY MOUTH EVERY 8 HOURS AS NEEDED FOR NAUSEA OR VOMITTING 30 tablet 1   rizatriptan (MAXALT) 10 MG tablet TAKE 1 TABLET BY MOUTH AS NEEDED FOR MIGRAINE. MAY REPEAT IN 2 HOURS IF NEEDED 10 tablet 11   Vitamin D, Ergocalciferol, (DRISDOL) 1.25 MG (50000 UNIT) CAPS capsule Take 1 capsule (50,000 Units total) by mouth every 7 (seven) days. 13 capsule 1   No current facility-administered medications for this visit.     Objective:  BP 100/70   Pulse 75   Temp 98 F (36.7 C)   Ht '5\' 4"'$  (1.626 m)   Wt 104 lb 12.8 oz (47.5 kg)   SpO2 98%   BMI 17.99 kg/m  Gen: NAD,  resting comfortably CV: RRR no murmurs rubs or gallops Lungs: CTAB no crackles, wheeze, rhonchi Abdomen: soft/mild tenderness in left upper quadrant with palpation just below the rib and and around to her back with no obvious rash in this area/nondistended/normal bowel sounds. No rebound or guarding.  Ext: no edema Skin: warm, dry Neuro: Looks to husband for most answers    Assessment and Plan   # Abdominal pain S:patient with left abdominal pain that radiates to left flank pain (grabs bottom side of left ribs) that happens after eating (can be breakfast, lunch or dinner).  Pain at least moderate to severe 6-8 increasing from moderate 4-6 several months ago. Gradually improves over hours after eating. Happens almost immediately after eating.  Mainly happens after lunch or dinner.  Actually feels like it has been going on for a prolonged period of time-did not give exact dates but has been worsening in frequency and intensity  Associated with nausea and cramping. Ondansetron will resolve the issue but makes her sleep for 3 hours. Seems to be picking up in frequency and intensity. This was already happening and already increasing in frequency before any meds were started- including namenda. Weight  down 1.2 lbs form 2 months ago.   - no vomiting -  daily BM- no change in stool. No blood in stool. No melena.  A/P: 71 year old female with dementia with postprandial left upper quadrant/left side/left thoracic back pain-typically grabbing over her left-sided ribs has been present for some time but is steadily increasing in intensity and frequency. - Could be acid reflux or ulcer related but given ongoing weight loss though gradual in the last 2 years we discussed referral to GI for their thoughts on potential endoscopy -Patient/husband were concerned about potential memory issues with PPI but they did agree to a short-term trial of Pepcid over-the-counter twice daily before meals to see if this helps  her -Ondansetron helps issues but makes her sleep for 3 hours and is not a great long-term solution -She has had prior GI issues with medications for dementia but this seemed to worsen even prior to starting Namenda in July   Recommended follow up: Return for as needed for new, worsening, persistent symptoms. Future Appointments  Date Time Provider [place]  03/08/2022  3:00 PM Levin Erp, [place] [facility]  05/22/2022 11:00 AM [facility] HEALTH COACH [facility]  06/22/2022  8:00 AM GI[facility] DX DEXA 1 [facility] GI-[facility]  07/21/2022  8:30 AM Cameron Sprang, MD [facility] None    Lab/Order associations:   ICD-10-CM   1. Postprandial abdominal pain in left upper quadrant  R10.12 Ambulatory referral to Gastroenterology      Time Spent: 23 minutes of total time (10 AM-10:23 AM) was spent on the date of the encounter performing the following actions: chart review prior to seeing the patient, obtaining history, performing a medically necessary exam, counseling on the treatment plan as well as potential options for treatment or work-up, placing orders, and documenting in our EHR.    Return precautions advised.  Garret Reddish, MD

## 2022-02-24 NOTE — Patient Instructions (Addendum)
Let us know when you get your flu shot at [facility]- high dose  [facility] contact Please call to schedule visit and/or procedure Address: [address], [place], [place] [address] Phone: [phone]   Trial pepcid/famotidine before breakfast and dinner  Can also try ginger  Recommended follow up: Return for as needed for new, worsening, persistent symptoms.

## 2022-03-06 ENCOUNTER — Encounter: Payer: Self-pay | Admitting: *Deleted

## 2022-03-08 ENCOUNTER — Encounter: Payer: Self-pay | Admitting: Physician Assistant

## 2022-03-08 ENCOUNTER — Ambulatory Visit (INDEPENDENT_AMBULATORY_CARE_PROVIDER_SITE_OTHER): Payer: Medicare Other | Admitting: Physician Assistant

## 2022-03-08 VITALS — BP 104/70 | HR 96 | Ht 65.0 in | Wt 104.4 lb

## 2022-03-08 DIAGNOSIS — R109 Unspecified abdominal pain: Secondary | ICD-10-CM

## 2022-03-08 MED ORDER — DICYCLOMINE HCL 10 MG PO CAPS
ORAL_CAPSULE | ORAL | 1 refills | Status: DC
Start: 2022-03-08 — End: 2022-06-16

## 2022-03-08 NOTE — Patient Instructions (Signed)
_______________________________________________________  If you are age 69 or older, your body mass index should be between 23-30. Your Body mass index is 17.37 kg/m. If this is out of the aforementioned range listed, please consider follow up with your Primary Care Provider.  If you are age 69 or younger, your body mass index should be between 19-25. Your Body mass index is 17.37 kg/m. If this is out of the aformentioned range listed, please consider follow up with your Primary Care Provider.   ________________________________________________________  The [facility] providers would like to encourage you to use [facility] to communicate with providers for non-urgent requests or questions.  Due to long hold times on the telephone, sending your provider a message by [facility] may be a faster and more efficient way to get a response.  Please allow 48 business hours for a response.  Please remember that this is for non-urgent requests.  _______________________________________________________  Dennis Bast have been scheduled for a CT scan of the abdomen and pelvis at [facility], 1st floor Radiology. You are scheduled on 03-14-2022 at 4pm. You should arrive 30 minutes prior to your appointment time for registration.  We are giving you 2 bottles of contrast today that you will need to drink before arriving for the exam. The solution may taste better if refrigerated so put them in the refrigerator when you get home, but do NOT add ice or any other liquid to this solution as that would dilute it. Shake well before drinking.   Please follow the written instructions below on the day of your exam:   1) Do not eat anything after 12pm (4 hours prior to your test)   2) Drink 1 bottle of contrast @ 2pm (2 hours prior to your exam)  Remember to shake well before drinking and do NOT pour over ice.     Drink 1 bottle of contrast @ 1pm (1 hour prior to your exam)   You may take any medications as prescribed with a  small amount of water, if necessary. If you take any of the following medications: METFORMIN, GLUCOPHAGE, GLUCOVANCE, AVANDAMET, RIOMET, FORTAMET, [place] MET, JANUMET, GLUMETZA or METAGLIP, you MAY be asked to HOLD this medication 48 hours AFTER the exam.   The purpose of you drinking the oral contrast is to aid in the visualization of your intestinal tract. The contrast solution may cause some diarrhea. Depending on your individual set of symptoms, you may also receive an intravenous injection of x-ray contrast/dye. Plan on being at [facility] for 45 minutes or longer, depending on the type of exam you are having performed.   If you have any questions regarding your exam or if you need to reschedule, you may call [facility] at [phone] between the hours of 8:00 am and 5:00 pm, Monday-Friday.   It was a pleasure to see you today!  Thank you for trusting me with your gastrointestinal care!

## 2022-03-08 NOTE — Progress Notes (Signed)
Chief Complaint: Left-sided abdominal pain  HPI:    Joanne Brock is a 71 year old female, known to Dr. Ardis Hughs, with a past medical history as listed below including Alzheimer's dementia, who was referred to me by Marin Olp, MD for a complaint of abdominal pain.    05/22/2018 colonoscopy with diverticulosis and otherwise normal.  Repeat recommended in 10 years.    02/24/2022 office visit with PC P, at that time had ongoing abdominal pain with nausea and cramping.  Discussed that patient had left abdominal pain that seem to radiate to the left flank typically after eating which is at least a 6-8/10 and had been ongoing over the past 4 to 6 months.  At that time discussed a PPI or Pepcid.  This was never started.    Today, the patient presents to clinic accompanied by her husband who is her sole caretaker due to her dementia.  He explains that over the past year or so she has had this left-sided abdominal pain which always starts almost immediately after eating if it is going to start.  Initially it was infrequent and now is happening at least once a day 4 to 5 days a week and sometimes more than once in a day.  Typically she will eat and within 5 to 10 minutes is complaining of left-sided abdominal pain and grabs her side and then goes to the bathroom.  He is not sure if she actually has a bowel movement or diarrhea as he has never followed her in there.  But she comes back to the table and typically continues with pain which can last for 2 to 5 hours depending on how severe it is.  This can happen anytime that she eats but is always brought on by food and pretty quickly.  Denies any change in bowel habits that he is aware of.  No new medications prior to the symptoms starting.  Initially were using Ondansetron at symptom onset which seemed to maybe help a little bit in the beginning but is not helping anymore.  Possibly associated with nausea but no episodes of vomiting.    Denies fever, chills,  weight loss or blood in her stool.  Past Medical History:  Diagnosis Date   Allergy    Alzheimer's dementia ([place])    Cancer ([place])    basal cell and squamous cell skin cancer   History of skin cancer    squamous and basal x 8 in 2018. Dr. Renda Rolls. Had been with Dr. Tonia Brooms.    Kidney stone    x1   Migraines    Aura- smell, spots. once a month- sumatriptan. topamax '50mg'$  once a day prophylaxis.    Osteopenia     Past Surgical History:  Procedure Laterality Date   COLONOSCOPY     TONSILLECTOMY Bilateral 1960    Current Outpatient Medications  Medication Sig Dispense Refill   memantine (NAMENDA) 10 MG tablet Take 1 tablet (10 mg total) by mouth 2 (two) times daily. Take 1 tablet every night for 2 weeks, then increase to 1 tablet twice a day 60 tablet 11   Multiple Vitamin (MULTIVITAMIN) capsule Take 1 capsule by mouth daily.     ondansetron (ZOFRAN-ODT) 4 MG disintegrating tablet TAKE 1 TABLET BY MOUTH EVERY 8 HOURS AS NEEDED FOR NAUSEA OR VOMITTING 30 tablet 1   rizatriptan (MAXALT) 10 MG tablet TAKE 1 TABLET BY MOUTH AS NEEDED FOR MIGRAINE. MAY REPEAT IN 2 HOURS IF NEEDED 10 tablet 11  Vitamin D, Ergocalciferol, (DRISDOL) 1.25 MG (50000 UNIT) CAPS capsule Take 1 capsule (50,000 Units total) by mouth every 7 (seven) days. 13 capsule 1   No current facility-administered medications for this visit.    Allergies as of 03/08/2022   (No Known Allergies)    Family History  Problem Relation Age of Onset   Hypertension Mother    Stroke Mother        TIAs- declined over years and stroke eventually. died 44   Other Father        decline after ? heat stroke. died around 90   Stroke Sister        63. nonsmoker.    Ulcerative colitis Daughter    Colon cancer Neg Hx    Colon polyps Neg Hx    Esophageal cancer Neg Hx    Stomach cancer Neg Hx    Rectal cancer Neg Hx     Social History   Socioeconomic History   Marital status: Married    Spouse name: Not on file   Number of  children: Not on file   Years of education: Not on file   Highest education level: Not on file  Occupational History   Occupation: retired  Tobacco Use   Smoking status: Never   Smokeless tobacco: Never  Vaping Use   Vaping Use: Never used  Substance and Sexual Activity   Alcohol use: No    Alcohol/week: 0.0 standard drinks of alcohol   Drug use: No   Sexual activity: Yes  Other Topics Concern   Not on file  Social History Narrative   Married over 83 years in 2018. 1 daughter 75 years old in 2023. 1 granddaughter born June 2023.    Had to spend great deal of time caring for aging parents- now passed.       Undergrad at [employer].    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 [employer]: travel   Right Handed   One Story Home   Does drink caffeine occasionally   Social Determinants of Health   Financial Resource Strain: Low Risk  (05/09/2021)   Overall Financial Resource Strain (CARDIA)    Difficulty of Paying Living Expenses: Not hard at all  Food Insecurity: No Food Insecurity (05/09/2021)   Hunger Vital Sign    Worried About Running Out of Food in the Last Year: Never true    Ran Out of Food in the Last Year: Never true  Transportation Needs: No Transportation Needs (05/09/2021)   PRAPARE - Hydrologist (Medical): No    Lack of Transportation (Non-Medical): No  Physical Activity: Sufficiently Active (05/09/2021)   Exercise Vital Sign    Days of Exercise per Week: 5 days    Minutes of Exercise per Session: 50 min  Stress: No Stress Concern Present (05/09/2021)   [place]    Feeling of Stress : Not at all  Social Connections: Moderately Integrated (05/09/2021)   Social Connection and Isolation Panel [NHANES]    Frequency of Communication with Friends and Family: Once a week    Frequency of Social Gatherings with Friends and Family: More than three times a  week    Attends Religious Services: More than 4 times per year    Active Member of [employer] or Organizations: No    Attends Archivist Meetings: Never    Marital Status: Married  Human resources officer  Violence: Not At Risk (05/09/2021)   Humiliation, Afraid, Rape, and Kick questionnaire    Fear of Current or Ex-Partner: No    Emotionally Abused: No    Physically Abused: No    Sexually Abused: No    Review of Systems:    Constitutional: No weight loss, fever or chills Skin: No rash  Cardiovascular: No chest pain   Respiratory: No SOB  Gastrointestinal: See HPI and otherwise negative Genitourinary: No dysuria Neurological: No headache, dizziness or syncope Musculoskeletal: No new muscle or joint pain Hematologic: No bleeding Psychiatric:+ Alzheimer's dementia   Physical Exam:  Vital signs: BP 104/70   Pulse 96   Ht '5\' 5"'$  (1.651 m)   Wt 104 lb 6 oz (47.3 kg)   BMI 17.37 kg/m   Constitutional:   Pleasant Caucasian female appears to be in NAD, Well developed, Well nourished, alert and cooperative Head:  Normocephalic and atraumatic. Eyes:   PEERL, EOMI. No icterus. Conjunctiva pink. Ears:  Normal auditory acuity. Neck:  Supple Throat: Oral cavity and pharynx without inflammation, swelling or lesion.  Respiratory: Respirations even and unlabored. Lungs clear to auscultation bilaterally.   No wheezes, crackles, or rhonchi.  Cardiovascular: Normal S1, S2. No MRG. Regular rate and rhythm. No peripheral edema, cyanosis or pallor.  Gastrointestinal:  Soft, nondistended, mild epigastric ttp, No rebound or guarding. Normal bowel sounds. No appreciable masses or hepatomegaly. Rectal:  Not performed.  Msk:  Symmetrical without gross deformities. Without edema, no deformity or joint abnormality.  Neurologic:  Alert and  oriented x4;  grossly normal neurologically.  Skin:   Dry and intact without significant lesions or rashes. Psychiatric: +alzheimers dementia  RELEVANT LABS AND  IMAGING: CBC    Component Value Date/Time   WBC 4.6 12/20/2021 1340   RBC 4.29 12/20/2021 1340   HGB 12.4 12/20/2021 1340   HCT 37.1 12/20/2021 1340   PLT 221.0 12/20/2021 1340   MCV 86.5 12/20/2021 1340   MCHC 33.5 12/20/2021 1340   RDW 12.9 12/20/2021 1340   LYMPHSABS 1.2 12/20/2021 1340   MONOABS 0.4 12/20/2021 1340   EOSABS 0.0 12/20/2021 1340   BASOSABS 0.0 12/20/2021 1340    CMP     Component Value Date/Time   NA 139 12/20/2021 1340   NA 140 04/11/2016 0000   NA 140 04/11/2016 0000   K 3.9 12/20/2021 1340   CL 103 12/20/2021 1340   CO2 29 12/20/2021 1340   GLUCOSE 88 12/20/2021 1340   BUN 11 12/20/2021 1340   BUN 12 04/11/2016 0000   BUN 12 04/11/2016 0000   CREATININE 0.91 12/20/2021 1340   CALCIUM 9.3 12/20/2021 1340   PROT 6.7 12/20/2021 1340   ALBUMIN 4.3 12/20/2021 1340   AST 16 12/20/2021 1340   ALT 6 12/20/2021 1340   ALKPHOS 74 12/20/2021 1340   BILITOT 0.4 12/20/2021 1340    Assessment: 1.  Left-sided abdominal pain: Patient tends to grab left lower side of the abdomen when the pain starts but it starts almost immediately after eating, unsure of change in bowel habits as no one visualizes this, no vomiting, possible nausea, pain in the epigastrium today; consider lower versus upper GI etiology, timing would make more sense for gastritis etc., but location makes more sense for lower anatomy  Plan: 1.  Ordered a CT of the abdomen pelvis with contrast for further evaluation to help [place] delineate if this is upper or lower GI. 2.  Started Dicyclomine 10 mg 1-2 tabs every 6 hours as needed #  30 with 1 refill. 3.  Discontinue Ondansetron as is not helping anymore 4.  Discussed possibly trying a PPI or Pepcid in the future. 5.  Patient to follow in clinic per recommendations after imaging above.  Note sent to Dr. Silverio Decamp today and Dr. Ardis Hughs absence.  Ellouise Newer, PA-C [facility] 03/08/2022, 3:08 PM  Cc: Marin Olp, MD

## 2022-03-14 ENCOUNTER — Ambulatory Visit (HOSPITAL_COMMUNITY)
Admission: RE | Admit: 2022-03-14 | Discharge: 2022-03-14 | Disposition: A | Discharge status: Home / Self Care | Payer: Medicare Other | Source: Ambulatory Visit | Attending: Physician Assistant | Admitting: Physician Assistant

## 2022-03-14 DIAGNOSIS — R109 Unspecified abdominal pain: Secondary | ICD-10-CM | POA: Insufficient documentation

## 2022-03-14 DIAGNOSIS — K5730 Diverticulosis of large intestine without perforation or abscess without bleeding: Secondary | ICD-10-CM | POA: Diagnosis not present

## 2022-03-14 LAB — POCT I-STAT CREATININE: Creatinine, Ser: 0.9 mg/dL (ref 0.44–1.00)

## 2022-03-14 MED ORDER — IOHEXOL 300 MG/ML  SOLN
100.0000 mL | Freq: Once | INTRAMUSCULAR | Status: AC | PRN
Start: 2022-03-14 — End: 2022-03-14
  Administered 2022-03-14: 100 mL via INTRAVENOUS

## 2022-03-20 MED ORDER — FAMOTIDINE 20 MG PO TABS: 40.0000 mg | ORAL_TABLET | Freq: Every day | ORAL | 0 refills | Status: DC

## 2022-03-20 NOTE — Addendum Note (Signed)
Addended by: Yevette Edwards on: 03/20/2022 12:53 PM   Modules accepted: Orders

## 2022-03-22 DIAGNOSIS — Z23 Encounter for immunization: Secondary | ICD-10-CM | POA: Diagnosis not present

## 2022-03-27 ENCOUNTER — Encounter: Payer: Self-pay | Admitting: Family Medicine

## 2022-04-02 ENCOUNTER — Other Ambulatory Visit: Payer: Self-pay | Admitting: Family Medicine

## 2022-05-05 ENCOUNTER — Other Ambulatory Visit: Payer: Self-pay | Admitting: Family Medicine

## 2022-05-22 ENCOUNTER — Ambulatory Visit (INDEPENDENT_AMBULATORY_CARE_PROVIDER_SITE_OTHER): Payer: Medicare Other

## 2022-05-22 VITALS — Wt 104.0 lb

## 2022-05-22 DIAGNOSIS — Z0000 Encounter for general adult medical examination without abnormal findings: Secondary | ICD-10-CM

## 2022-05-22 NOTE — Patient Instructions (Signed)
Joanne Brock , Thank you for taking time to come for your Medicare Wellness Visit. I appreciate your ongoing commitment to your health goals. Please review the following plan we discussed and let me know if I can assist you in the future.   These are the goals we discussed:  Goals      Patient Stated     Not at this times     Patient Stated     None at this time      Patient Stated     Do more walking         This is a list of the screening recommended for you and due dates:  Health Maintenance  Topic Date Due   COVID-19 Vaccine (6 - 2023-24 season) 02/10/2022   Mammogram  12/21/2022*   DTaP/Tdap/Td vaccine (2 - Td or Tdap) 06/12/2022   Medicare Annual Wellness Visit  05/23/2023   Colon Cancer Screening  05/22/2028   Pneumonia Vaccine  Completed   Flu Shot  Completed   DEXA scan (bone density measurement)  Completed   Hepatitis C Screening: USPSTF Recommendation to screen - Ages 25-79 yo.  Completed   Zoster (Shingles) Vaccine  Completed   HPV Vaccine  Aged Out  *Topic was postponed. The date shown is not the original due date.    Advanced directives: Please bring a copy of your health care power of attorney and living will to the office at your convenience.  Conditions/risks identified: to do more walking   Next appointment: Follow up in one year for your annual wellness visit    Preventive Care 65 Years and Older, Female Preventive care refers to lifestyle choices and visits with your health care provider that can promote health and wellness. What does preventive care include? A yearly physical exam. This is also called an annual well check. Dental exams once or twice a year. Routine eye exams. Ask your health care provider how often you should have your eyes checked. Personal lifestyle choices, including: Daily care of your teeth and gums. Regular physical activity. Eating a healthy diet. Avoiding tobacco and drug use. Limiting alcohol use. Practicing safe  sex. Taking low-dose aspirin every day. Taking vitamin and mineral supplements as recommended by your health care provider. What happens during an annual well check? The services and screenings done by your health care provider during your annual well check will depend on your age, overall health, lifestyle risk factors, and family history of disease. Counseling  Your health care provider may ask you questions about your: Alcohol use. Tobacco use. Drug use. Emotional well-being. Home and relationship well-being. Sexual activity. Eating habits. History of falls. Memory and ability to understand (cognition). Work and work Statistician. Reproductive health. Screening  You may have the following tests or measurements: Height, weight, and BMI. Blood pressure. Lipid and cholesterol levels. These may be checked every 5 years, or more frequently if you are over 35 years old. Skin check. Lung cancer screening. You may have this screening every year starting at age 77 if you have a 30-pack-year history of smoking and currently smoke or have quit within the past 15 years. Fecal occult blood test (FOBT) of the stool. You may have this test every year starting at age 54. Flexible sigmoidoscopy or colonoscopy. You may have a sigmoidoscopy every 5 years or a colonoscopy every 10 years starting at age 2. Hepatitis C blood test. Hepatitis B blood test. Sexually transmitted disease (STD) testing. Diabetes screening. This is done by  checking your blood sugar (glucose) after you have not eaten for a while (fasting). You may have this done every 1-3 years. Bone density scan. This is done to screen for osteoporosis. You may have this done starting at age 29. Mammogram. This may be done every 1-2 years. Talk to your health care provider about how often you should have regular mammograms. Talk with your health care provider about your test results, treatment options, and if necessary, the need for more  tests. Vaccines  Your health care provider may recommend certain vaccines, such as: Influenza vaccine. This is recommended every year. Tetanus, diphtheria, and acellular pertussis (Tdap, Td) vaccine. You may need a Td booster every 10 years. Zoster vaccine. You may need this after age 67. Pneumococcal 13-valent conjugate (PCV13) vaccine. One dose is recommended after age 5. Pneumococcal polysaccharide (PPSV23) vaccine. One dose is recommended after age 29. Talk to your health care provider about which screenings and vaccines you need and how often you need them. This information is not intended to replace advice given to you by your health care provider. Make sure you discuss any questions you have with your health care provider. Document Released: 06/25/2015 Document Revised: 02/16/2016 Document Reviewed: 03/30/2015 Elsevier Interactive Patient Education  2017 [place] Prevention in the Home Falls can cause injuries. They can happen to people of all ages. There are many things you can do to make your home safe and to help prevent falls. What can I do on the outside of my home? Regularly fix the edges of walkways and driveways and fix any cracks. Remove anything that might make you trip as you walk through a door, such as a raised step or threshold. Trim any bushes or trees on the path to your home. Use bright outdoor lighting. Clear any walking paths of anything that might make someone trip, such as rocks or tools. Regularly check to see if handrails are loose or broken. Make sure that both sides of any steps have handrails. Any raised decks and porches should have guardrails on the edges. Have any leaves, snow, or ice cleared regularly. Use sand or salt on walking paths during winter. Clean up any spills in your garage right away. This includes oil or grease spills. What can I do in the bathroom? Use night lights. Install grab bars by the toilet and in the tub and shower.  Do not use towel bars as grab bars. Use non-skid mats or decals in the tub or shower. If you need to sit down in the shower, use a plastic, non-slip stool. Keep the floor dry. Clean up any water that spills on the floor as soon as it happens. Remove soap buildup in the tub or shower regularly. Attach bath mats securely with double-sided non-slip rug tape. Do not have throw rugs and other things on the floor that can make you trip. What can I do in the bedroom? Use night lights. Make sure that you have a light by your bed that is easy to reach. Do not use any sheets or blankets that are too big for your bed. They should not hang down onto the floor. Have a firm chair that has side arms. You can use this for support while you get dressed. Do not have throw rugs and other things on the floor that can make you trip. What can I do in the kitchen? Clean up any spills right away. Avoid walking on wet floors. Keep items that you use a  lot in easy-to-reach places. If you need to reach something above you, use a strong step stool that has a grab bar. Keep electrical cords out of the way. Do not use floor polish or wax that makes floors slippery. If you must use wax, use non-skid floor wax. Do not have throw rugs and other things on the floor that can make you trip. What can I do with my stairs? Do not leave any items on the stairs. Make sure that there are handrails on both sides of the stairs and use them. Fix handrails that are broken or loose. Make sure that handrails are as long as the stairways. Check any carpeting to make sure that it is firmly attached to the stairs. Fix any carpet that is loose or worn. Avoid having throw rugs at the top or bottom of the stairs. If you do have throw rugs, attach them to the floor with carpet tape. Make sure that you have a light switch at the top of the stairs and the bottom of the stairs. If you do not have them, ask someone to add them for you. What else  can I do to help prevent falls? Wear shoes that: Do not have high heels. Have rubber bottoms. Are comfortable and fit you well. Are closed at the toe. Do not wear sandals. If you use a stepladder: Make sure that it is fully opened. Do not climb a closed stepladder. Make sure that both sides of the stepladder are locked into place. Ask someone to hold it for you, if possible. Clearly mark and make sure that you can see: Any grab bars or handrails. First and last steps. Where the edge of each step is. Use tools that help you move around (mobility aids) if they are needed. These include: Canes. Walkers. Scooters. Crutches. Turn on the lights when you go into a dark area. Replace any light bulbs as soon as they burn out. Set up your furniture so you have a clear path. Avoid moving your furniture around. If any of your floors are uneven, fix them. If there are any pets around you, be aware of where they are. Review your medicines with your doctor. Some medicines can make you feel dizzy. This can increase your chance of falling. Ask your doctor what other things that you can do to help prevent falls. This information is not intended to replace advice given to you by your health care provider. Make sure you discuss any questions you have with your health care provider. Document Released: 03/25/2009 Document Revised: 11/04/2015 Document Reviewed: 07/03/2014 Elsevier Interactive Patient Education  2017 [employer].

## 2022-05-22 NOTE — Progress Notes (Addendum)
I connected with  Joanne Brock on 05/22/22 by a audio enabled telemedicine application and verified that I am speaking with the correct person using two identifiers.Along with Husband Marquis Diles   Patient Location: Home  Provider Location: Office/Clinic  I discussed the limitations of evaluation and management by telemedicine. The patient expressed understanding and agreed to proceed.   Subjective:   Joanne Brock is a 71 y.o. female who presents for Medicare Annual (Subsequent) preventive examination.  Review of Systems     Cardiac Risk Factors include: advanced age (>65mn, >>49women);dyslipidemia     Objective:    Today's Vitals   05/22/22 1048  Weight: 104 lb (47.2 kg)   Body mass index is 17.31 kg/m.     05/22/2022   10:57 AM 12/27/2021    8:19 AM 05/09/2021   11:11 AM 04/26/2020   10:24 AM 03/09/2020    8:31 AM 01/27/2020    8:51 AM  Advanced Directives  Does Patient Have a Medical Advance Directive? _0  Yes  Type of AParamedicof AWayne CityLiving will [place]Living will;Out of facility DNR (pink MOST or yellow form) Healthcare Power of [place]zadaLiving will [place]ingenLiving will;Out of facility DNR (pink MOST or yellow form) HPortage LakesOut of facility DNR (pink MOST or yellow form);Living will  Copy of HCedar [place]reekin Chart? No - copy requested  No - copy requested No - copy requested      Current Medications (verified) Outpatient Encounter Medications as of 05/22/2022  Medication Sig   famotidine (PEPCID) 20 MG tablet Take 2 tablets (40 mg total) by mouth daily.   memantine (NAMENDA) 10 MG tablet Take 1 tablet (10 mg total) by mouth 2 (two) times daily. Take 1 tablet every night for 2 weeks, then increase to 1 tablet twice a day   ondansetron (ZOFRAN-ODT) 4 MG disintegrating tablet DISSOLVE 1 TABLET BY MOUTH EVERY 8  HOURS AS NEEDED FOR NAUSEA AND/OR VOMITING   rizatriptan (MAXALT) 10 MG tablet TAKE 1 TABLET BY MOUTH AS NEEDED FOR MIGRAINE. MAY REPEAT IN 2 HOURS IF NEEDED   Vitamin D, Ergocalciferol, (DRISDOL) 1.25 MG (50000 UNIT) CAPS capsule Take 1 capsule (50,000 Units total) by mouth every 7 (seven) days.   dicyclomine (BENTYL) 10 MG capsule 1-2 tabs every 6 hours as needed (Patient not taking: Reported on 05/22/2022)   Multiple Vitamin (MULTIVITAMIN) capsule Take 1 capsule by mouth daily. (Patient not taking: Reported on 05/22/2022)   No facility-administered encounter medications on file as of 05/22/2022.    Allergies (verified) Patient has no known allergies.   History: Past Medical History:  Diagnosis Date   Allergy    Alzheimer's dementia ([place]    Cancer ([place]usset[place]    basal cell and squamous cell skin cancer   History of skin cancer    squamous and basal x 8 in 2018. Dr. HRenda Rolls Had been with Dr. GTonia Brooms    Kidney stone    x1   Migraines    Aura- smell, spots. once a month- sumatriptan. topamax 541monce a day prophylaxis.    Osteopenia    Past Surgical History:  Procedure Laterality Date   COLONOSCOPY     TONSILLECTOMY Bilateral 1960   Family History  Problem Relation Age of Onset   Hypertension Mother    Stroke Mother        TIAs- declined over years and stroke eventually. died [id number]  Other Father        decline after ? heat stroke. died around 71   Stroke Sister        38. nonsmoker.    Ulcerative colitis Daughter    Colon cancer Neg Hx    Colon polyps Neg Hx    Esophageal cancer Neg Hx    Stomach cancer Neg Hx    Rectal cancer Neg Hx    Social History   Socioeconomic History   Marital status: Married    Spouse name: Not on file   Number of children: Not on file   Years of education: Not on file   Highest education level: Not on file  Occupational History   Occupation: retired  Tobacco Use   Smoking status: Never   Smokeless tobacco: Never  Vaping Use    Vaping Use: Never used  Substance and Sexual Activity   Alcohol use: No    Alcohol/week: 0.0 standard drinks of alcohol   Drug use: No   Sexual activity: Yes  Other Topics Concern   Not on file  Social History Narrative   Married over 69 years in 2018. 1 daughter 50 years old in 2023. 1 granddaughter born June 2023.    Had to spend great deal of time caring for aging parents- now passed.       Undergrad at [employer].    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   Right Handed   One Story Home   Does drink caffeine occasionally   Social Determinants of Health   Financial Resource Strain: Low Risk  (05/22/2022)   Overall Financial Resource Strain (CARDIA)    Difficulty of Paying Living Expenses: Not hard at all  Food Insecurity: No Food Insecurity (05/22/2022)   Hunger Vital Sign    Worried About Running Out of Food in the Last Year: Never true    Ran Out of Food in the Last Year: Never true  Transportation Needs: No Transportation Needs (05/22/2022)   PRAPARE - Hydrologist (Medical): No    Lack of Transportation (Non-Medical): No  Physical Activity: Inactive (05/22/2022)   Exercise Vital Sign    Days of Exercise per Week: 0 days    Minutes of Exercise per Session: 0 min  Stress: No Stress Concern Present (05/22/2022)   [place]    Feeling of Stress : Not at all  Social Connections: Moderately Integrated (05/22/2022)   Social Connection and Isolation Panel [NHANES]    Frequency of Communication with Friends and Family: More than three times a week    Frequency of Social Gatherings with Friends and Family: More than three times a week    Attends Religious Services: More than 4 times per year    Active Member of [employer] or Organizations: No    Attends Music therapist: Never    Marital Status: Married    Tobacco  Counseling Counseling given: Not Answered   Clinical Intake:  Pre-visit preparation completed: Yes  Pain : No/denies pain     BMI - recorded: 17.31 Nutritional Status: BMI <19  Underweight Nutritional Risks: None Diabetes: No  How often do you need to have someone help you when you read instructions, pamphlets, or other written materials from your doctor or pharmacy?: 1 - Never  Diabetic?no  Interpreter Needed?: No  Information entered by :: Charlott Rakes, LPN  Activities of Daily Living    05/22/2022   10:58 AM  In your present state of health, do you have any difficulty performing the following activities:  Hearing? 0  Vision? 0  Difficulty concentrating or making decisions? 0  Walking or climbing stairs? 0  Dressing or bathing? 0  Doing errands, shopping? 0  Preparing Food and eating ? N  Using the Toilet? N  In the past six months, have you accidently leaked urine? N  Do you have problems with loss of bowel control? N  Managing your Medications? N  Managing your Finances? N  Housekeeping or managing your Housekeeping? N    Patient Care Team: Marin Olp, MD as PCP - General (Family Medicine) Renda Rolls, Jennefer Bravo, MD as Referring Physician (Dermatology) Paula Compton, MD as Consulting Physician (Obstetrics and Gynecology) Marilynne Halsted, MD as Referring Physician (Ophthalmology) Cameron Sprang, MD as Consulting Physician (Neurology)  Indicate any recent Medical Services you may have received from other than [facility] providers in the past year (date may be approximate).     Assessment:   This is a routine wellness examination for Mariesha.  Hearing/Vision screen Hearing Screening - Comments:: Pt denies any hearing issues  Vision Screening - Comments:: Pt follows up with Dr Darnell Level for annul eye exams   Dietary issues and exercise activities discussed: Current Exercise Habits: The patient does not participate in regular exercise at present    Goals Addressed             This Visit's Progress    Patient Stated       Do more walking        Depression Screen    05/22/2022   10:55 AM 05/09/2021   11:10 AM 07/20/2020    1:10 PM 04/26/2020   10:22 AM 04/23/2020   11:24 AM 10/21/2019    1:16 PM 06/27/2019    9:18 AM  PHQ 2/9 Scores  PHQ - 2 Score 0 0 0 0 0 0 3  PHQ- 9 Score     0  12    Fall Risk    05/22/2022   10:57 AM 12/27/2021    8:19 AM 05/09/2021   11:12 AM 04/26/2020   10:25 AM 03/09/2020    8:31 AM  Fall Risk   Falls in the past year? 0 0 0 0 1  Number falls in past yr: 0 0 0 0 0  Injury with Fall? 0 0 0 0 1  Risk for fall due to :   Impaired vision Impaired vision   Follow up Falls prevention discussed  Falls prevention discussed Falls prevention discussed     FALL RISK PREVENTION PERTAINING TO THE HOME:  Any stairs in or around the home? Yes  If so, are there any without handrails? No  Home free of loose throw rugs in walkways, pet beds, electrical cords, etc? Yes  Adequate lighting in your home to reduce risk of falls? Yes   ASSISTIVE DEVICES UTILIZED TO PREVENT FALLS:  Life alert? Yes  Use of a cane, walker or w/c? No  Grab bars in the bathroom? Yes  Shower chair or bench in shower? Yes  Elevated toilet seat or a handicapped toilet? No   TIMED UP AND GO:  Was the test performed? No .   Cognitive Function:    12/27/2021    8:00 AM 10/21/2019    2:09 PM  MMSE - Mini Mental State Exam  Orientation to time 1 2  Orientation to  Place 2 4  Registration 3 3  Attention/ Calculation 0 3  Recall 0 3  Language- name 2 objects 0 1  Language- repeat 1 1  Language- follow 3 step command 1 3  Language- read & follow direction 1 1  Write a sentence 0 1  Copy design 0 1  Total score 9 23      02/25/2020    9:00 AM  Montreal Cognitive Assessment   Visuospatial/ Executive (0/5) 2  Naming (0/3) 2  Attention: Read list of digits (0/2) 1  Attention: Read list of letters (0/1) 1  Attention:  Serial 7 subtraction starting at 100 (0/3) 0  Language: Repeat phrase (0/2) 2  Language : Fluency (0/1) 0  Abstraction (0/2) 1  Delayed Recall (0/5) 0  Orientation (0/6) 5  Total 14  Adjusted Score (based on education) 14      05/09/2021   11:15 AM  6CIT Screen  What Year? 4 points  What month? 3 points  What time? 3 points  Count back from 20 0 points  Months in reverse 4 points  Repeat phrase 10 points  Total Score 24 points    Immunizations Immunization History  Administered Date(s) Administered   Fluad Quad(high Dose 65+) 03/15/2020   Hepatitis A 11/05/2009   Influenza, High Dose Seasonal PF 03/19/2018, 03/24/2019, 03/11/2021, 03/09/2022   Influenza,inj,Quad PF,6+ Mos 04/03/2017   Influenza-Unspecified 03/03/2009, 03/09/2010, 03/14/2011, 03/19/2012, 03/31/2013, 03/27/2014, 04/02/2015, 04/11/2016   MMR 11/12/2017   PFIZER Comirnaty(Gray Top)Covid-19 Tri-Sucrose Vaccine 10/28/2020   PFIZER(Purple Top)SARS-COV-2 Vaccination 07/01/2019, 07/21/2019, 04/15/2020, 04/25/2021   Pfizer Covid-19 Vaccine Bivalent Booster 93yr & up 04/24/2021   Pneumococcal Conjugate-13 11/12/2017   Pneumococcal Polysaccharide-23 04/23/2019   Respiratory Syncytial Virus Vaccine,Recomb Aduvanted(Arexvy) 02/18/2022   Tdap 06/12/2012   Zoster Recombinat (Shingrix) 04/29/2019, 09/30/2019   Zoster, Live 03/20/2013    TDAP status: Up to date  Flu Vaccine status: Up to date  Pneumococcal vaccine status: Up to date  Covid-19 vaccine status: Completed vaccines  Qualifies for Shingles Vaccine? Yes   Zostavax completed Yes   Shingrix Completed?: Yes  Screening Tests Health Maintenance  Topic Date Due   COVID-19 Vaccine (6 - 2023-24 season) 02/10/2022   MAMMOGRAM  12/21/2022 (Originally 05/31/2021)   DTaP/Tdap/Td (2 - Td or Tdap) 06/12/2022   Medicare Annual Wellness (AWV)  05/23/2023   COLONOSCOPY (Pts 45-493yrInsurance coverage will need to be confirmed)  05/22/2028   Pneumonia Vaccine  6546Years old  Completed   INFLUENZA VACCINE  Completed   DEXA SCAN  Completed   Hepatitis C Screening  Completed   Zoster Vaccines- Shingrix  Completed   HPV VACCINES  Aged Out    Health Maintenance  Health Maintenance Due  Topic Date Due   COVID-19 Vaccine (6 - 2023-24 season) 02/10/2022    Colorectal cancer screening: Type of screening: Colonoscopy. Completed 05/22/18. Repeat every 10 years  Mammogram status: Completed 2023 pt will update in my chart . Repeat every year  Bone Density status: Completed 11/12/19. Results reflect: Bone density results: OSTEOPENIA. Repeat every scheduled 06/22/22 years.  Additional Screening:  Hepatitis C Screening:  Completed 04/16/17  Vision Screening: Recommended annual ophthalmology exams for early detection of glaucoma and other disorders of the eye. Is the patient up to date with their annual eye exam?  Yes  Who is the provider or what is the name of the office in which the patient attends annual eye exams? Dr G Darnell Levelf pt is not established with a provider, would  they like to be referred to a provider to establish care? No .   Dental Screening: Recommended annual dental exams for proper oral hygiene  Community Resource Referral / Chronic Care Management: CRR required this visit?  No   CCM required this visit?  No      Plan:     I have personally reviewed and noted the following in the patient's chart:   Medical and social history Use of alcohol, tobacco or illicit drugs  Current medications and supplements including opioid prescriptions. Patient is not currently taking opioid prescriptions. Functional ability and status Nutritional status Physical activity Advanced directives List of other physicians Hospitalizations, surgeries, and ER visits in previous 12 months Vitals Screenings to include cognitive, depression, and falls Referrals and appointments  In addition, I have reviewed and discussed with patient certain preventive  protocols, quality metrics, and best practice recommendations. A written personalized care plan for preventive services as well as general preventive health recommendations were provided to patient.     Joanne Brace, LPN   [id number]   Nurse Notes: Pt spouse declined due to Dx , pt was alert and knowledgeable to questions with some assistance from Husband.

## 2022-06-16 ENCOUNTER — Telehealth (INDEPENDENT_AMBULATORY_CARE_PROVIDER_SITE_OTHER): Payer: Medicare Other | Admitting: Family Medicine

## 2022-06-16 DIAGNOSIS — U071 COVID-19: Secondary | ICD-10-CM | POA: Diagnosis not present

## 2022-06-16 MED ORDER — NIRMATRELVIR/RITONAVIR (PAXLOVID)TABLET: 3.0000 | ORAL_TABLET | Freq: Two times a day (BID) | ORAL | 0 refills | Status: AC

## 2022-06-16 NOTE — Progress Notes (Unsigned)
Assessment/Plan:   Problem List Items Addressed This Visit   None      Subjective:  HPI: Talking to husband  Joanne Brock is a 72 y.o. female who has Hyperlipidemia; History of skin cancer; Migraines; Vitamin D deficiency; History of adenomatous polyp of colon; Osteopenia; Morton neuroma, left; and Late onset Alzheimer's disease without behavioral disturbance ([place]) on their problem list..   She  has a past medical history of Allergy, Alzheimer's dementia ([place]), Cancer ([place]), History of skin cancer, Kidney stone, Migraines, and Osteopenia..   She presents with chief complaint of Covid Positive (Positive yesterday. Body aches, cough, headaches x 2 days) .     Past Surgical History:  Procedure Laterality Date   COLONOSCOPY     TONSILLECTOMY Bilateral 1960    Outpatient Medications Prior to Visit  Medication Sig Dispense Refill   memantine (NAMENDA) 10 MG tablet Take 1 tablet (10 mg total) by mouth 2 (two) times daily. Take 1 tablet every night for 2 weeks, then increase to 1 tablet twice a day 60 tablet 11   ondansetron (ZOFRAN-ODT) 4 MG disintegrating tablet DISSOLVE 1 TABLET BY MOUTH EVERY 8 HOURS AS NEEDED FOR NAUSEA AND/OR VOMITING 30 tablet 1   rizatriptan (MAXALT) 10 MG tablet TAKE 1 TABLET BY MOUTH AS NEEDED FOR MIGRAINE. MAY REPEAT IN 2 HOURS IF NEEDED 10 tablet 11   Vitamin D, Ergocalciferol, (DRISDOL) 1.25 MG (50000 UNIT) CAPS capsule Take 1 capsule (50,000 Units total) by mouth every 7 (seven) days. 13 capsule 1   dicyclomine (BENTYL) 10 MG capsule 1-2 tabs every 6 hours as needed (Patient not taking: Reported on 05/22/2022) 30 capsule 1   famotidine (PEPCID) 20 MG tablet Take 2 tablets (40 mg total) by mouth daily. (Patient not taking: Reported on 06/16/2022) 30 tablet 0   Multiple Vitamin (MULTIVITAMIN) capsule Take 1 capsule by mouth daily. (Patient not taking: Reported on 05/22/2022)     No facility-administered medications prior to visit.    Family History   Problem Relation Age of Onset   Hypertension Mother    Stroke Mother        TIAs- declined over years and stroke eventually. died 16   Other Father        decline after ? heat stroke. died around 40   Stroke Sister        66. nonsmoker.    Ulcerative colitis Daughter    Colon cancer Neg Hx    Colon polyps Neg Hx    Esophageal cancer Neg Hx    Stomach cancer Neg Hx    Rectal cancer Neg Hx     Social History   Socioeconomic History   Marital status: Married    Spouse name: Not on file   Number of children: Not on file   Years of education: Not on file   Highest education level: Not on file  Occupational History   Occupation: retired  Tobacco Use   Smoking status: Never   Smokeless tobacco: Never  Vaping Use   Vaping Use: Never used  Substance and Sexual Activity   Alcohol use: No    Alcohol/week: 0.0 standard drinks of alcohol   Drug use: No   Sexual activity: Yes  Other Topics Concern   Not on file  Social History Narrative   Married over 19 years in 2018. 1 daughter 40 years old in 2023. 1 granddaughter born June 2023.    Had to spend great deal of time caring for aging parents-  now passed.       Undergrad at [employer].    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   Right Handed   One Story Home   Does drink caffeine occasionally   Social Determinants of Health   Financial Resource Strain: Low Risk  (05/22/2022)   Overall Financial Resource Strain (CARDIA)    Difficulty of Paying Living Expenses: Not hard at all  Food Insecurity: No Food Insecurity (05/22/2022)   Hunger Vital Sign    Worried About Running Out of Food in the Last Year: Never true    Ran Out of Food in the Last Year: Never true  Transportation Needs: No Transportation Needs (05/22/2022)   PRAPARE - Hydrologist (Medical): No    Lack of Transportation (Non-Medical): No  Physical Activity: Inactive (05/22/2022)   Exercise Vital  Sign    Days of Exercise per Week: 0 days    Minutes of Exercise per Session: 0 min  Stress: No Stress Concern Present (05/22/2022)   [place]    Feeling of Stress : Not at all  Social Connections: Moderately Integrated (05/22/2022)   Social Connection and Isolation Panel [NHANES]    Frequency of Communication with Friends and Family: More than three times a week    Frequency of Social Gatherings with Friends and Family: More than three times a week    Attends Religious Services: More than 4 times per year    Active Member of [employer] or Organizations: No    Attends Archivist Meetings: Never    Marital Status: Married  Human resources officer Violence: Not At Risk (05/22/2022)   Humiliation, Afraid, Rape, and Kick questionnaire    Fear of Current or Ex-Partner: No    Emotionally Abused: No    Physically Abused: No    Sexually Abused: No                                                                                                 Objective:  Physical Exam: There were no vitals taken for this visit.   ***General: No acute distress. Awake and conversant.  Eyes: Normal conjunctiva, anicteric. Round symmetric pupils.  ENT: Hearing grossly intact. No nasal discharge.  Neck: Neck is supple. No masses or thyromegaly.  Respiratory: Respirations are non-labored. No auditory wheezing.  Skin: Warm. No rashes or ulcers.  Psych: Alert and oriented. Cooperative, Appropriate mood and affect, Normal judgment.  CV: No cyanosis or JVD MSK: Normal ambulation. No clubbing  Neuro: Sensation and CN II-XII grossly normal.        Alesia Banda, MD, MS

## 2022-06-18 ENCOUNTER — Other Ambulatory Visit: Payer: Self-pay | Admitting: Family Medicine

## 2022-06-20 NOTE — Progress Notes (Incomplete)
Assessment/Plan:   Problem List Items Addressed This Visit   None Visit Diagnoses     COVID    -  Primary   Relevant Medications   nirmatrelvir/ritonavir (PAXLOVID) 20 x 150 MG & 10 x '100MG'$  TABS          Subjective:  HPI: Talking to husband  Joanne Brock is a 72 y.o. female who has Hyperlipidemia; History of skin cancer; Migraines; Vitamin D deficiency; History of adenomatous polyp of colon; Osteopenia; Morton neuroma, left; and Late onset Alzheimer's disease without behavioral disturbance ([place]) on their problem list..   She  has a past medical history of Allergy, Alzheimer's dementia ([place]), Cancer ([place]), History of skin cancer, Kidney stone, Migraines, and Osteopenia..   She presents with chief complaint of Covid Positive (Positive yesterday. Body aches, cough, headaches x 2 days) .     Past Surgical History:  Procedure Laterality Date  . COLONOSCOPY    . TONSILLECTOMY Bilateral 1960    Outpatient Medications Prior to Visit  Medication Sig Dispense Refill  . memantine (NAMENDA) 10 MG tablet Take 1 tablet (10 mg total) by mouth 2 (two) times daily. Take 1 tablet every night for 2 weeks, then increase to 1 tablet twice a day 60 tablet 11  . ondansetron (ZOFRAN-ODT) 4 MG disintegrating tablet DISSOLVE 1 TABLET BY MOUTH EVERY 8 HOURS AS NEEDED FOR NAUSEA AND/OR VOMITING 30 tablet 1  . rizatriptan (MAXALT) 10 MG tablet TAKE 1 TABLET BY MOUTH AS NEEDED FOR MIGRAINE. MAY REPEAT IN 2 HOURS IF NEEDED 10 tablet 11  . Vitamin D, Ergocalciferol, (DRISDOL) 1.25 MG (50000 UNIT) CAPS capsule Take 1 capsule (50,000 Units total) by mouth every 7 (seven) days. 13 capsule 1  . dicyclomine (BENTYL) 10 MG capsule 1-2 tabs every 6 hours as needed (Patient not taking: Reported on 05/22/2022) 30 capsule 1  . famotidine (PEPCID) 20 MG tablet Take 2 tablets (40 mg total) by mouth daily. (Patient not taking: Reported on 06/16/2022) 30 tablet 0  . Multiple Vitamin (MULTIVITAMIN) capsule Take 1  capsule by mouth daily. (Patient not taking: Reported on 05/22/2022)     No facility-administered medications prior to visit.    Family History  Problem Relation Age of Onset  . Hypertension Mother   . Stroke Mother        TIAs- declined over years and stroke eventually. died 63  . Other Father        decline after ? heat stroke. died around 29  . Stroke Sister        63. nonsmoker.   Marland Kitchen Ulcerative colitis Daughter   . Colon cancer Neg Hx   . Colon polyps Neg Hx   . Esophageal cancer Neg Hx   . Stomach cancer Neg Hx   . Rectal cancer Neg Hx     Social History   Socioeconomic History  . Marital status: Married    Spouse name: Not on file  . Number of children: Not on file  . Years of education: Not on file  . Highest education level: Not on file  Occupational History  . Occupation: retired  Tobacco Use  . Smoking status: Never  . Smokeless tobacco: Never  Vaping Use  . Vaping Use: Never used  Substance and Sexual Activity  . Alcohol use: No    Alcohol/week: 0.0 standard drinks of alcohol  . Drug use: No  . Sexual activity: Yes  Other Topics Concern  . Not on file  Social History Narrative  Married over 40 years in 2018. 1 daughter 21 years old in 2023. 1 granddaughter born June 2023.    Had to spend great deal of time caring for aging parents- now passed.       Undergrad at [employer].    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 Difficulty of Paying Living Expenses: Not hard at all  Food Insecurity: No Food Insecurity (05/22/2022)   Hunger Vital Sign   . Worried About Charity fundraiser in the Last Year: Never true   . Ran Out of Food in the Last Year: Never true  Transportation Needs: No Transportation Needs (05/22/2022)    PRAPARE - Transportation   . Lack of Transportation (Medical): No   . Lack of Transportation (Non-Medical): No  Physical Activity: Inactive (05/22/2022)   Exercise Vital Sign   . Days of Exercise per Week: 0 days   . Minutes of Exercise per Session: 0 min  Stress: No Stress Concern Present (05/22/2022)   [place]   . Feeling of Stress : Not at all  Social Connections: Moderately Integrated (05/22/2022)   Social Connection and Isolation Panel [NHANES]   . Frequency of Communication with Friends and Family: More than three times a week   . Frequency of Social Gatherings with Friends and Family: More than three times a week   . Attends Religious Services: More than 4 times per year   . Active Member of Clubs or Organizations: No   . Attends Archivist Meetings: Never   . Marital Status: Married  Human resources officer Violence: Not At Risk (05/22/2022)   Humiliation, Afraid, Rape, and Kick questionnaire   . Fear of Current or Ex-Partner: No   . Emotionally Abused: No   . Physically Abused: No   . Sexually Abused: No                                                                                                 Objective:  Physical Exam: There were no vitals taken for this visit.   ***General: No acute distress. Awake and conversant.  Eyes: Normal conjunctiva, anicteric. Round symmetric pupils.  ENT: Hearing grossly intact. No nasal discharge.  Neck: Neck is supple. No masses or thyromegaly.  Respiratory: Respirations are non-labored. No auditory wheezing.  Skin: Warm. No rashes or ulcers.  Psych: Alert and oriented. Cooperative, Appropriate mood and affect, Normal judgment.  CV: No cyanosis or JVD MSK: Normal ambulation. No clubbing  Neuro: Sensation and CN II-XII grossly normal.        Alesia Banda, MD, MS

## 2022-06-21 ENCOUNTER — Other Ambulatory Visit: Payer: Self-pay | Admitting: Family Medicine

## 2022-06-21 DIAGNOSIS — M85851 Other specified disorders of bone density and structure, right thigh: Secondary | ICD-10-CM

## 2022-06-22 ENCOUNTER — Inpatient Hospital Stay: Admission: RE | Admit: 2022-06-22 | Payer: Medicare Other | Source: Ambulatory Visit

## 2022-07-21 ENCOUNTER — Ambulatory Visit (INDEPENDENT_AMBULATORY_CARE_PROVIDER_SITE_OTHER): Payer: Medicare Other | Admitting: Neurology

## 2022-07-21 ENCOUNTER — Encounter: Payer: Self-pay | Admitting: Neurology

## 2022-07-21 VITALS — BP 128/77 | HR 88 | Ht 64.0 in | Wt 99.8 lb

## 2022-07-21 DIAGNOSIS — F0280 Dementia in other diseases classified elsewhere without behavioral disturbance: Secondary | ICD-10-CM | POA: Diagnosis not present

## 2022-07-21 DIAGNOSIS — G301 Alzheimer's disease with late onset: Secondary | ICD-10-CM

## 2022-07-21 MED ORDER — MEMANTINE HCL 10 MG PO TABS: ORAL_TABLET | ORAL | 3 refills | Status: DC

## 2022-07-21 NOTE — Progress Notes (Signed)
NEUROLOGY FOLLOW UP OFFICE NOTE  KIJAH POMPILIO IZ:[id number] 1950-12-03  HISTORY OF PRESENT ILLNESS: I had the pleasure of seeing Joanne Brock in follow-up in the neurology clinic on 07/21/2022.  The patient was last seen 7 months ago for Alzheimer's dementia without behavioral disturbance. She is again accompanied by her husband who helps supplement the history today. On her last visit, they reported GI side effects on Donepezil and was started on Memantine 79m BID. Her husband sent a MyChart message prior to the visit requesting that MMSE testing nto be done as KMeilanifinds it very upsetting. Overall health appears good. Appetite is diminished but she is willing to eat. There has been some loss in cognition over the last 90 days but not dramatic. She remains upbeat and willing to socialize with other. She requires direction to complete some activities but completes most independently.   She denies any headaches, dizziness, focal numbness/tingling/weakness. Her husband reports that for a stretch of time she was having more migraines and needing Maxalt regularly, but since starting Memantine, there has been an improvement in migraines. Overall cognitive decline has been a slow progression. She looks to her husband to answer most questions, she could not recall their granddaughter's name. Her husband manages medications, finances, meals. She does not drive. Supervision is required for dressing and bathing, she is given instructions and can execute instructions. She bathes three times a week. No behavioral changes, paranoia, or hallucinations. Her husband expressed concern about her weight today, she has lost 7-9lbs since her last visit with Dr. HYong Channel She continues to eat the same foods but eats half of it. She had asymptomatic Covid 3-4 weeks ago, did not lose sense of taste. No abdominal pain or diarrhea. She sleeps well at night and takes a nap in the mornings. They walk and socialize with friends  regularly.    History on Initial Assessment 01/27/2020: This is a pleasant 72year old right-handed woman with a history of skin cancer, migraines, nephrolithiasis, presenting for evaluation of memory loss and worsening migraines. She feels like memory issues do not affect her on a regular basis, and started noticing changes a few months ago. Her husband started noticing changes towards the last quarter of 2019. She is a retired CEngineer, maintenance (IT)and would be able to do simple math in her head, but started using the calculator more. She started noticing that calculations she could do were not coming easily, now taking her a longer time. She used to do the books in 45 minutes, and started taking 6 hours to complete. Her husband took over 3-4 months ago. She was not missing any payments. She denies getting lost driving, leaving the stove on, misplacing things. She is not on any regular medications, she takes prn Maxalt for migraine rescue. Her husband feels changes have been overall stable since 2019 but noticed she is working around them more. Sometimes she asks the same question, especially with time, repeatedly asking when it is time for them to leave. She has occasional word-finding issues. Her mother had memory loss consistent with dementia. Her father had a dementing illness and drug-induced Parkinson's disease but did not have memory issues. No history of significant head injuries or alcohol use.  She started having migraines in her 446s with pressure over the right temporal region majority of the time. Occasionally she has left temporal pain. No associated nausea/vomiting, photo/phonophobia, visual obscurations. She takes prn Maxalt with good effect. She was having migraines more frequently last May, but  this has quieted down, she does not need to take Maxalt every month. She denies any dizziness, diplopia, dysarthria/dysphagia, neck/back pain, focal numbness/tingling/weakness, bowel/bladder dysfunction, anosmia, or  tremors. Mood is good. No paranoia or hallucinations, no personality changes. Sleep is good. She retired in January 2018.  I personally reviewed MRI brain without contrast done 11/2019 which did not show any acute changes. There was mild diffuse atrophy and a few punctate foci of T2 hyperintensity, within normal limits for age.   Neuropsychological evaluation in 02/2020 indicated Alzheimer's dementia without behavioral disturbance, MCI level of function.   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 Dr. Renda Rolls. Had been with Dr. Tonia Brooms.    Kidney stone    x1   Migraines    Aura- smell, spots. once a month- sumatriptan. topamax 26m once a day prophylaxis.    Osteopenia     MEDICATIONS: Current Outpatient Medications on File Prior to Visit  Medication Sig Dispense Refill   memantine (NAMENDA) 10 MG tablet Take 1 tablet (10 mg total) by mouth 2 (two) times daily. Take 1 tablet every night for 2 weeks, then increase to 1 tablet twice a day 60 tablet 11   ondansetron (ZOFRAN-ODT) 4 MG disintegrating tablet DISSOLVE 1 TABLET BY MOUTH EVERY 8 HOURS AS NEEDED FOR NAUSEA AND/OR VOMITING 30 tablet 1   rizatriptan (MAXALT) 10 MG tablet TAKE 1 TABLET BY MOUTH AS NEEDED FOR MIGRAINE. MAY REPEAT IN 2 HOURS IF NEEDED 10 tablet 11   Vitamin D, Ergocalciferol, (DRISDOL) 1.25 MG (50000 UNIT) CAPS capsule TAKE 1 CAPSULE BY MOUTH EVERY 7 DAYS 13 capsule 1   No current facility-administered medications on file prior to visit.    ALLERGIES: No Known Allergies  FAMILY HISTORY: Family History  Problem Relation Age of Onset   Hypertension Mother    Stroke Mother        TIAs-  declined over years and stroke eventually. died 881  Other Father        decline after ? heat stroke. died around 866  Stroke Sister        690 nonsmoker.    Ulcerative colitis Daughter    Colon cancer Neg Hx    Colon polyps Neg Hx    Esophageal cancer Neg Hx    Stomach cancer Neg Hx    Rectal cancer Neg Hx     SOCIAL HISTORY: Social History   Socioeconomic History   Marital status: Married    Spouse name: Not on file   Number of children: Not on file   Years of education: Not on file   Highest education level: Not on file  Occupational History   Occupation: retired  Tobacco Use   Smoking status: Never   Smokeless tobacco: Never  Vaping Use   Vaping Use: Never used  Substance and Sexual Activity   Alcohol use: No    Alcohol/week: 0.0 standard drinks of alcohol   Drug use: No   Sexual activity: Yes  Other Topics Concern   Not on file  Social History Narrative   Married over 447  years in 2018. 1 daughter 74 years old in 2023. 1 granddaughter born June 2023.    Had to spend great deal of time caring for aging parents- now passed.       Undergrad at [employer].    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   Right Handed   One Story Home   Does drink caffeine occasionally   Social Determinants of Health   Financial Resource Strain: Low Risk  (05/22/2022)   Overall Financial Resource Strain (CARDIA)    Difficulty of Paying Living Expenses: Not hard at all  Food Insecurity: No Food Insecurity (05/22/2022)   Hunger Vital Sign    Worried About Running Out of Food in the Last Year: Never true    Ran Out of Food in the Last Year: Never true  Transportation Needs: No Transportation Needs (05/22/2022)   PRAPARE - Hydrologist (Medical): No    Lack of Transportation (Non-Medical): No  Physical Activity: Inactive (05/22/2022)   Exercise Vital Sign    Days of Exercise per Week: 0 days    Minutes of Exercise  per Session: 0 min  Stress: No Stress Concern Present (05/22/2022)   [place]    Feeling of Stress : Not at all  Social Connections: Moderately Integrated (05/22/2022)   Social Connection and Isolation Panel [NHANES]    Frequency of Communication with Friends and Family: More than three times a week    Frequency of Social Gatherings with Friends and Family: More than three times a week    Attends Religious Services: More than 4 times per year    Active Member of [employer] or Organizations: No    Attends Archivist Meetings: Never    Marital Status: Married  Human resources officer Violence: Not At Risk (05/22/2022)   Humiliation, Afraid, Rape, and Kick questionnaire    Fear of Current or Ex-Partner: No    Emotionally Abused: No    Physically Abused: No    Sexually Abused: No     PHYSICAL EXAM: Vitals:   07/21/22 0825  BP: 128/77  Pulse: 88  SpO2: 98%   General: No acute distress Head:  Normocephalic/atraumatic Skin/Extremities: No rash, no edema Neurological Exam: alert and awake. No aphasia or dysarthria. Fund of knowledge is reduced, she looks to her husband to answer questions. Attention and concentration are normal.   Cranial nerves: Pupils equal, round. Extraocular movements intact with no nystagmus. Visual fields full.  No facial asymmetry.  Motor: Bulk and tone normal, muscle strength 5/5 throughout with no pronator drift.   Finger to nose testing intact.  Gait narrow-based and steady, able to tandem walk adequately.  Romberg negative.   IMPRESSION: This is a pleasant 72 yo RH woman with a history of skin cancer, migraines, nephrolithiasis, migraines, with Alzheimer's dementia without behavioral disturbance. There has been slow progression, continue Memantine 67m BID. MMSE in 12/2021 was 9/30, we agreed that we will follow her clinically and hold off on repeat MMSE testing as this is very upsetting for her.  Continue close supervision. She has had decreased appetite and weight loss, husband has procured Ensure. We discussed consideration for Nutritionist evaluation for guidance. Continue increasing physical and social activities. Follow-up in 6 months, call for any changes.   Thank you for allowing me to participate in her care.  Please do not hesitate to call for any questions  or concerns.    Ellouise Newer, M.D.   CC: Dr. Yong Channel

## 2022-07-21 NOTE — Patient Instructions (Signed)
Good to see you. Continue Memantine 109m twice a day. Let me know if you would like a referral to see a Nutritionist. Follow-up in 6 months, call for any changes.   FALL PRECAUTIONS: Be cautious when walking. Scan the area for obstacles that may increase the risk of trips and falls. When getting up in the mornings, sit up at the edge of the bed for a few minutes before getting out of bed. Consider elevating the bed at the head end to avoid drop of blood pressure when getting up. Walk always in a well-lit room (use night lights in the walls). Avoid area rugs or power cords from appliances in the middle of the walkways. Use a walker or a cane if necessary and consider physical therapy for balance exercise. Get your eyesight checked regularly.  HOME SAFETY: Consider the safety of the kitchen when operating appliances like stoves, microwave oven, and blender. Consider having supervision and share cooking responsibilities until no longer able to participate in those. Accidents with firearms and other hazards in the house should be identified and addressed as well.  ABILITY TO BE LEFT ALONE: If patient is unable to contact 911 operator, consider using LifeLine, or when the need is there, arrange for someone to stay with patients. Smoking is a fire hazard, consider supervision or cessation. Risk of wandering should be assessed by caregiver and if detected at any point, supervision and safe proof recommendations should be instituted.   RECOMMENDATIONS FOR ALL PATIENTS WITH MEMORY PROBLEMS: 1. Continue to exercise (Recommend 30 minutes of walking everyday, or 3 hours every week) 2. Increase social interactions - continue [place] enjoy social gatherings with friends and family 3. Eat healthy, avoid fried foods and eat more fruits and vegetables 4. Maintain adequate blood pressure, blood sugar, and blood cholesterol level. Reducing the risk of stroke and cardiovascular disease also helps promoting  better memory. 5. Avoid stressful situations. Live a simple life and avoid aggravations. Organize your time and prepare for the next day in anticipation. 6. Sleep well, avoid any interruptions of sleep and avoid any distractions in the bedroom that may interfere with adequate sleep quality 7. Avoid sugar, avoid sweets as there is a strong link between excessive sugar intake, diabetes, and cognitive impairment The Mediterranean diet has been shown to help patients reduce the risk of progressive memory disorders and reduces cardiovascular risk. This includes eating fish, eat fruits and green leafy vegetables, nuts like almonds and hazelnuts, walnuts, and also use olive oil. Avoid fast foods and fried foods as much as possible. Avoid sweets and sugar as sugar use has been linked to worsening of memory function.  There is always a concern of gradual progression of memory problems. If this is the case, then we may need to adjust level of care according to patient needs. Support, both to the patient and caregiver, should then be put into place.

## 2022-10-03 ENCOUNTER — Telehealth: Payer: Self-pay | Admitting: Anesthesiology

## 2022-10-03 NOTE — Telephone Encounter (Signed)
Ok for earlier refill, thanks

## 2022-10-03 NOTE — Telephone Encounter (Signed)
Pt's husband called stating yesterday he knocked the pill bottle over by accident and he was not able to save most of the pills but only 5. The medication is memantine 10 mg. States he called the pharmacy to fill the Rx again, he was told that it was too soon and he was told to call the doctor and request a new Rx.

## 2022-10-05 NOTE — Telephone Encounter (Signed)
Per rep [place] patient husband pick up with a discount card since insurance denied early pick up.

## 2022-10-16 DIAGNOSIS — L821 Other seborrheic keratosis: Secondary | ICD-10-CM | POA: Diagnosis not present

## 2022-10-16 DIAGNOSIS — Z85828 Personal history of other malignant neoplasm of skin: Secondary | ICD-10-CM | POA: Diagnosis not present

## 2022-10-16 DIAGNOSIS — Z86018 Personal history of other benign neoplasm: Secondary | ICD-10-CM | POA: Diagnosis not present

## 2022-10-16 DIAGNOSIS — L578 Other skin changes due to chronic exposure to nonionizing radiation: Secondary | ICD-10-CM | POA: Diagnosis not present

## 2022-10-16 DIAGNOSIS — D225 Melanocytic nevi of trunk: Secondary | ICD-10-CM | POA: Diagnosis not present

## 2022-10-16 DIAGNOSIS — L814 Other melanin hyperpigmentation: Secondary | ICD-10-CM | POA: Diagnosis not present

## 2022-10-16 DIAGNOSIS — Z808 Family history of malignant neoplasm of other organs or systems: Secondary | ICD-10-CM | POA: Diagnosis not present

## 2022-11-29 ENCOUNTER — Ambulatory Visit
Admission: RE | Admit: 2022-11-29 | Discharge: 2022-11-29 | Disposition: A | Discharge status: Home / Self Care | Payer: Medicare Other | Source: Ambulatory Visit | Attending: Family Medicine | Admitting: Family Medicine

## 2022-11-29 DIAGNOSIS — M85851 Other specified disorders of bone density and structure, right thigh: Secondary | ICD-10-CM

## 2022-11-29 DIAGNOSIS — N959 Unspecified menopausal and perimenopausal disorder: Secondary | ICD-10-CM | POA: Diagnosis not present

## 2022-11-29 DIAGNOSIS — E349 Endocrine disorder, unspecified: Secondary | ICD-10-CM | POA: Diagnosis not present

## 2022-12-06 DIAGNOSIS — M8580 Other specified disorders of bone density and structure, unspecified site: Secondary | ICD-10-CM | POA: Diagnosis not present

## 2022-12-06 DIAGNOSIS — Z01419 Encounter for gynecological examination (general) (routine) without abnormal findings: Secondary | ICD-10-CM | POA: Diagnosis not present

## 2022-12-12 ENCOUNTER — Ambulatory Visit: Payer: Medicare Other

## 2022-12-13 ENCOUNTER — Telehealth: Payer: Self-pay | Admitting: Family Medicine

## 2022-12-13 NOTE — Telephone Encounter (Signed)
Noted and Tdap documented.

## 2022-12-13 NOTE — Telephone Encounter (Signed)
Msg sent to Admin pool:   Joanne Brock received a diptheria/tetanus/acell shot today at the [facility] on [address].

## 2023-01-11 DIAGNOSIS — Z1231 Encounter for screening mammogram for malignant neoplasm of breast: Secondary | ICD-10-CM | POA: Diagnosis not present

## 2023-01-11 LAB — HM MAMMOGRAPHY

## 2023-02-14 ENCOUNTER — Encounter: Payer: Self-pay | Admitting: Neurology

## 2023-02-16 ENCOUNTER — Ambulatory Visit (INDEPENDENT_AMBULATORY_CARE_PROVIDER_SITE_OTHER): Payer: Medicare Other | Admitting: Neurology

## 2023-02-16 ENCOUNTER — Encounter: Payer: Self-pay | Admitting: Neurology

## 2023-02-16 VITALS — BP 121/66 | HR 79 | Ht 65.0 in | Wt 107.4 lb

## 2023-02-16 DIAGNOSIS — F0280 Dementia in other diseases classified elsewhere without behavioral disturbance: Secondary | ICD-10-CM

## 2023-02-16 DIAGNOSIS — G301 Alzheimer's disease with late onset: Secondary | ICD-10-CM

## 2023-02-16 MED ORDER — MEMANTINE HCL 10 MG PO TABS: ORAL_TABLET | ORAL | 3 refills | Status: DC

## 2023-02-16 NOTE — Progress Notes (Signed)
NEUROLOGY FOLLOW UP OFFICE NOTE  Joanne Brock [id number] May 12, 1951  HISTORY OF PRESENT ILLNESS: I had the pleasure of seeing Joanne Brock in follow-up in the neurology clinic on 02/16/2023.  The patient was last seen on 7 months ago for Alzheimer's dementia without behavioral disturbance. She is again accompanied by her husband who helps supplement the history today. Since her last visit, she is overall doing well. Her husband reports there is some diminishing in her cognition, but physically she has been doing well. She is able to do ADLs but requires prompting and supervision, with step by step instructions that do get done. Her husband manages medications, finances, meals. They lives at continuing care at [facility]. She does not have a lot of regular activities (ie exercise) during the day, but they do stay in touch and socialize with friends and family regularly. They occasionally take walks around the building (3/4 mile). Sleep is good. She denies any headaches, they have not needed prn Maxalt refills in 4 months. No dizziness, focal numbness/tingling/weakness. She is tolerating Memantine 10mg  BID without side effects. Mood is good, her good nature has not changed but she does have more difficulties with sequence/back and forth in conversations. No paranoia or hallucinations. Appetite is good, she is able to feed herself.    History on Initial Assessment 01/27/2020: This is a pleasant 72 year old right-handed woman with a history of skin cancer, migraines, nephrolithiasis, presenting for evaluation of memory loss and worsening migraines. She feels like memory issues do not affect her on a regular basis, and started noticing changes a few months ago. Her husband started noticing changes towards the last quarter of 2019. She is a retired IT trainer and would be able to do simple math in her head, but started using the calculator more. She started noticing that calculations she could do were not coming  easily, now taking her a longer time. She used to do the books in 45 minutes, and started taking 6 hours to complete. Her husband took over 3-4 months ago. She was not missing any payments. She denies getting lost driving, leaving the stove on, misplacing things. She is not on any regular medications, she takes prn Maxalt for migraine rescue. Her husband feels changes have been overall stable since 2019 but noticed she is working around them more. Sometimes she asks the same question, especially with time, repeatedly asking when it is time for them to leave. She has occasional word-finding issues. Her mother had memory loss consistent with dementia. Her father had a dementing illness and drug-induced Parkinson's disease but did not have memory issues. No history of significant head injuries or alcohol use.  She started having migraines in her 50s, with pressure over the right temporal region majority of the time. Occasionally she has left temporal pain. No associated nausea/vomiting, photo/phonophobia, visual obscurations. She takes prn Maxalt with good effect. She was having migraines more frequently last May, but this has quieted down, she does not need to take Maxalt every month. She denies any dizziness, diplopia, dysarthria/dysphagia, neck/back pain, focal numbness/tingling/weakness, bowel/bladder dysfunction, anosmia, or tremors. Mood is good. No paranoia or hallucinations, no personality changes. Sleep is good. She retired in January 2018.  I personally reviewed MRI brain without contrast done 11/2019 which did not show any acute changes. There was mild diffuse atrophy and a few punctate foci of T2 hyperintensity, within normal limits for age.   Neuropsychological evaluation in 02/2020 indicated Alzheimer's dementia without behavioral disturbance, MCI level of  function.   Laboratory Data: Lab Results  Component Value Date   TSH 2.04 10/21/2019   Lab Results  Component Value Date   VITAMINB12 328  10/21/2019    PAST MEDICAL HISTORY: Past Medical History:  Diagnosis Date   Allergy    Alzheimer's dementia (HCC)    Cancer (HCC)    basal cell and squamous cell skin cancer   History of skin cancer    squamous and basal x 8 in 2018. Dr. Sharyn Lull. Had been with Dr. Danella Deis.    Kidney stone    x1   Migraines    Aura- smell, spots. once a month- sumatriptan. topamax 50mg  once a day prophylaxis.    Osteopenia     MEDICATIONS: Current Outpatient Medications on File Prior to Visit  Medication Sig Dispense Refill   memantine (NAMENDA) 10 MG tablet Take 1 tablet twice a day 180 tablet 3   Multiple Vitamin (MULTIVITAMIN) tablet Take 1 tablet by mouth daily.     ondansetron (ZOFRAN-ODT) 4 MG disintegrating tablet DISSOLVE 1 TABLET BY MOUTH EVERY 8 HOURS AS NEEDED FOR NAUSEA AND/OR VOMITING 30 tablet 1   rizatriptan (MAXALT) 10 MG tablet TAKE 1 TABLET BY MOUTH AS NEEDED FOR MIGRAINE. MAY REPEAT IN 2 HOURS IF NEEDED 10 tablet 11   No current facility-administered medications on file prior to visit.    ALLERGIES: No Known Allergies  FAMILY HISTORY: Family History  Problem Relation Age of Onset   Hypertension Mother    Stroke Mother        TIAs- declined over years and stroke eventually. died 40   Other Father        decline after ? heat stroke. died around 21   Stroke Sister        51. nonsmoker.    Ulcerative colitis Daughter    Colon cancer Neg Hx    Colon polyps Neg Hx    Esophageal cancer Neg Hx    Stomach cancer Neg Hx    Rectal cancer Neg Hx     SOCIAL HISTORY: Social History   Socioeconomic History   Marital status: Married    Spouse name: Not on file   Number of children: Not on file   Years of education: Not on file   Highest education level: Not on file  Occupational History   Occupation: retired  Tobacco Use   Smoking status: Never   Smokeless tobacco: Never  Vaping Use   Vaping status: Never Used  Substance and Sexual Activity   Alcohol use: No     Alcohol/week: 0.0 standard drinks of alcohol   Drug use: No   Sexual activity: Yes  Other Topics Concern   Not on file  Social History Narrative   Married over 40 years in 2018. 1 daughter 8 years old in 2023. 1 granddaughter born June 2023.    Had to spend great deal of time caring for aging parents- now passed.       Undergrad at [employer].    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 [employer]      Hobbies: travel   Right Handed   One Story Home   Does drink caffeine occasionally   Social Determinants of Health   Financial Resource Strain: Low Risk  (05/22/2022)   Overall Financial Resource Strain (CARDIA)    Difficulty of Paying Living Expenses: Not hard at all  Food Insecurity: No Food Insecurity (05/22/2022)   Hunger Vital Sign    Worried About Running  Out of Food in the Last Year: Never true    Ran Out of Food in the Last Year: Never true  Transportation Needs: No Transportation Needs (05/22/2022)   PRAPARE - Administrator, Civil Service (Medical): No    Lack of Transportation (Non-Medical): No  Physical Activity: Inactive (05/22/2022)   Exercise Vital Sign    Days of Exercise per Week: 0 days    Minutes of Exercise per Session: 0 min  Stress: No Stress Concern Present (05/22/2022)   [employer] of Occupational Health - Occupational Stress Questionnaire    Feeling of Stress : Not at all  Social Connections: Moderately Integrated (05/22/2022)   Social Connection and Isolation Panel [NHANES]    Frequency of Communication with Friends and Family: More than three times a week    Frequency of Social Gatherings with Friends and Family: More than three times a week    Attends Religious Services: More than 4 times per year    Active Member of [employer] or Organizations: No    Attends Banker Meetings: Never    Marital Status: Married  Catering manager Violence: Not At Risk (05/22/2022)   Humiliation, Afraid, Rape, and Kick questionnaire     Fear of Current or Ex-Partner: No    Emotionally Abused: No    Physically Abused: No    Sexually Abused: No     PHYSICAL EXAM: Vitals:   02/16/23 0819  BP: 121/66  Pulse: 79  SpO2: 99%   General: No acute distress Head:  Normocephalic/atraumatic Skin/Extremities: No rash, no edema Neurological Exam: alert and awake. No aphasia or dysarthria. Fund of knowledge is reduced, looks to husband when asked questions. Recent and remote memory are impaired. Attention and concentration are normal. She is able to follow instructions, needs some repetition for movements on the left side. Cranial nerves: Pupils equal, round. Extraocular movements intact with no nystagmus. Visual fields full.  No facial asymmetry.  Motor: Bulk and tone normal, muscle strength 5/5 throughout with no pronator drift.   Finger to nose testing intact.  Gait narrow-based and steady, able to tandem walk adequately.  Romberg negative.   IMPRESSION: This is a pleasant 72 yo RH woman with a history of skin cancer, migraines, nephrolithiasis, migraines, with Alzheimer's dementia without behavioral disturbance. There has been slow progression, she is able to do ADLs with prompting and supervision. MMSE in 12/2021 was 9/30, we have agreed to follow her clinically and hold off on repeat MMSE testing as this is very upsetting for her. Her husband is interested in a dementia-certified occupational therapist, I think this would be helpful for her moving forward, we discussed potential benefits. He will inquire regarding [facility] at [facility] and let us know if a referral is needed. [facility] also are options for them. Continue close supervision. Follow-up in 6 months, call for any changes.   Thank you for allowing me to participate in her care.  Please do not hesitate to call for any questions or concerns.    Patrcia Dolly, M.D.   CC: Dr. Durene Cal

## 2023-02-16 NOTE — Patient Instructions (Signed)
Always a pleasure to see you. Continue Memantine 10mg  twice a day. Let me know if you need a referral for Occupational Therapy. Follow-up in 6 months, call for any changes   FALL PRECAUTIONS: Be cautious when walking. Scan the area for obstacles that may increase the risk of trips and falls. When getting up in the mornings, sit up at the edge of the bed for a few minutes before getting out of bed. Consider elevating the bed at the head end to avoid drop of blood pressure when getting up. Walk always in a well-lit room (use night lights in the walls). Avoid area rugs or power cords from appliances in the middle of the walkways. Use a walker or a cane if necessary and consider physical therapy for balance exercise. Get your eyesight checked regularly.   HOME SAFETY: Consider the safety of the kitchen when operating appliances like stoves, microwave oven, and blender. Consider having supervision and share cooking responsibilities until no longer able to participate in those. Accidents with firearms and other hazards in the house should be identified and addressed as well.   ABILITY TO BE LEFT ALONE: If patient is unable to contact 911 operator, consider using LifeLine, or when the need is there, arrange for someone to stay with patients. Smoking is a fire hazard, consider supervision or cessation. Risk of wandering should be assessed by caregiver and if detected at any point, supervision and safe proof recommendations should be instituted.   RECOMMENDATIONS FOR ALL PATIENTS WITH MEMORY PROBLEMS: 1. Continue to exercise (Recommend 30 minutes of walking everyday, or 3 hours every week) 2. Increase social interactions - continue going to [place] and enjoy social gatherings with friends and family 3. Eat healthy, avoid fried foods and eat more fruits and vegetables 4. Maintain adequate blood pressure, blood sugar, and blood cholesterol level. Reducing the risk of stroke and cardiovascular disease also helps  promoting better memory. 5. Avoid stressful situations. Live a simple life and avoid aggravations. Organize your time and prepare for the next day in anticipation. 6. Sleep well, avoid any interruptions of sleep and avoid any distractions in the bedroom that may interfere with adequate sleep quality 7. Avoid sugar, avoid sweets as there is a strong link between excessive sugar intake, diabetes, and cognitive impairment The Mediterranean diet has been shown to help patients reduce the risk of progressive memory disorders and reduces cardiovascular risk. This includes eating fish, eat fruits and green leafy vegetables, nuts like almonds and hazelnuts, walnuts, and also use olive oil. Avoid fast foods and fried foods as much as possible. Avoid sweets and sugar as sugar use has been linked to worsening of memory function.  There is always a concern of gradual progression of memory problems. If this is the case, then we may need to adjust level of care according to patient needs. Support, both to the patient and caregiver, should then be put into place.

## 2023-02-22 ENCOUNTER — Encounter: Payer: Self-pay | Admitting: Neurology

## 2023-02-23 ENCOUNTER — Other Ambulatory Visit: Payer: Self-pay

## 2023-02-23 DIAGNOSIS — F0280 Dementia in other diseases classified elsewhere without behavioral disturbance: Secondary | ICD-10-CM

## 2023-02-26 ENCOUNTER — Other Ambulatory Visit: Payer: Self-pay

## 2023-02-26 DIAGNOSIS — F0280 Dementia in other diseases classified elsewhere without behavioral disturbance: Secondary | ICD-10-CM

## 2023-03-15 DIAGNOSIS — Z23 Encounter for immunization: Secondary | ICD-10-CM | POA: Diagnosis not present

## 2023-03-29 DIAGNOSIS — F0390 Unspecified dementia without behavioral disturbance: Secondary | ICD-10-CM | POA: Diagnosis not present

## 2023-03-29 DIAGNOSIS — R41841 Cognitive communication deficit: Secondary | ICD-10-CM | POA: Diagnosis not present

## 2023-04-02 DIAGNOSIS — F0390 Unspecified dementia without behavioral disturbance: Secondary | ICD-10-CM | POA: Diagnosis not present

## 2023-04-02 DIAGNOSIS — R41841 Cognitive communication deficit: Secondary | ICD-10-CM | POA: Diagnosis not present

## 2023-04-09 DIAGNOSIS — R41841 Cognitive communication deficit: Secondary | ICD-10-CM | POA: Diagnosis not present

## 2023-04-09 DIAGNOSIS — F0390 Unspecified dementia without behavioral disturbance: Secondary | ICD-10-CM | POA: Diagnosis not present

## 2023-04-11 DIAGNOSIS — F0390 Unspecified dementia without behavioral disturbance: Secondary | ICD-10-CM | POA: Diagnosis not present

## 2023-04-11 DIAGNOSIS — R41841 Cognitive communication deficit: Secondary | ICD-10-CM | POA: Diagnosis not present

## 2023-05-21 ENCOUNTER — Other Ambulatory Visit (INDEPENDENT_AMBULATORY_CARE_PROVIDER_SITE_OTHER): Payer: Medicare Other

## 2023-05-21 ENCOUNTER — Ambulatory Visit (INDEPENDENT_AMBULATORY_CARE_PROVIDER_SITE_OTHER): Payer: Medicare Other | Admitting: Physician Assistant

## 2023-05-21 VITALS — BP 102/70 | HR 95 | Temp 97.5°F | Resp 16 | Ht 65.0 in | Wt 108.6 lb

## 2023-05-21 DIAGNOSIS — R1084 Generalized abdominal pain: Secondary | ICD-10-CM | POA: Diagnosis not present

## 2023-05-21 LAB — URINALYSIS, ROUTINE W REFLEX MICROSCOPIC
Bilirubin Urine: NEGATIVE
Hgb urine dipstick: NEGATIVE
Ketones, ur: NEGATIVE
Leukocytes,Ua: NEGATIVE
Nitrite: NEGATIVE
Specific Gravity, Urine: 1.025 (ref 1.000–1.030)
Urine Glucose: NEGATIVE
Urobilinogen, UA: 0.2 (ref 0.0–1.0)
pH: 6 (ref 5.0–8.0)

## 2023-05-21 LAB — CBC WITH DIFFERENTIAL/PLATELET
Basophils Absolute: 0 10*3/uL (ref 0.0–0.1)
Basophils Relative: 0.3 % (ref 0.0–3.0)
Eosinophils Absolute: 0 10*3/uL (ref 0.0–0.7)
Eosinophils Relative: 0.1 % (ref 0.0–5.0)
HCT: 39.2 % (ref 36.0–46.0)
Hemoglobin: 13 g/dL (ref 12.0–15.0)
Lymphocytes Relative: 15.1 % (ref 12.0–46.0)
Lymphs Abs: 1 10*3/uL (ref 0.7–4.0)
MCHC: 33.1 g/dL (ref 30.0–36.0)
MCV: 89.2 fL (ref 78.0–100.0)
Monocytes Absolute: 0.5 10*3/uL (ref 0.1–1.0)
Monocytes Relative: 7.1 % (ref 3.0–12.0)
Neutro Abs: 5 10*3/uL (ref 1.4–7.7)
Neutrophils Relative %: 77.4 % — ABNORMAL HIGH (ref 43.0–77.0)
Platelets: 252 10*3/uL (ref 150.0–400.0)
RBC: 4.39 Mil/uL (ref 3.87–5.11)
RDW: 13 % (ref 11.5–15.5)
WBC: 6.5 10*3/uL (ref 4.0–10.5)

## 2023-05-21 LAB — COMPREHENSIVE METABOLIC PANEL
ALT: 11 U/L (ref 0–35)
AST: 19 U/L (ref 0–37)
Albumin: 4.4 g/dL (ref 3.5–5.2)
Alkaline Phosphatase: 82 U/L (ref 39–117)
BUN: 14 mg/dL (ref 6–23)
CO2: 30 meq/L (ref 19–32)
Calcium: 9.9 mg/dL (ref 8.4–10.5)
Chloride: 104 meq/L (ref 96–112)
Creatinine, Ser: 0.89 mg/dL (ref 0.40–1.20)
GFR: 65.54 mL/min (ref >60.00)
Glucose, Bld: 95 mg/dL (ref 70–99)
Potassium: 3.9 meq/L (ref 3.5–5.1)
Sodium: 143 meq/L (ref 135–145)
Total Bilirubin: 0.5 mg/dL (ref 0.2–1.2)
Total Protein: 7.3 g/dL (ref 6.0–8.3)

## 2023-05-21 NOTE — Progress Notes (Signed)
0   Patient ID: Joanne Brock, female    DOB: [birth year], 72 y.o.   MRN: [id number]   Assessment & Plan:  Diffuse abdominal pain -     DG Abd 2 Views; Future -     Urinalysis, Routine w reflex microscopic; Future -     CBC with Differential/Platelet; Future -     Comprehensive metabolic panel; Future -     Urine Culture; Future   Assessment and Plan    Diffuse Abdominal Pain Pain for six days, initially managed with Ondansetron. Pain has shifted from upper to lower abdomen, not an acute abdomen in office today. No guarding or rebound tenderness. No fever, vomiting, or clear evidence of loose stools. History of diverticulosis. Constipation possible due to lack of well-formed bowel movements. -Order abdominal X-ray, CBC, CMP, and urine culture -If results indicate severe constipation, initiate treatment for constipation and monitor closely for worsening symptoms. -Strict ER precautions discussed, patient's husband agreeable with plan.  -If white blood cell count is significantly elevated, will plan to send to ER for eval / CT imaging.      Return if symptoms worsen or fail to improve.    Subjective:    Chief Complaint  Patient presents with   Abdominal Pain    Complains of lower abdominal pain, started about 6 days ago.    HPI Discussed the use of AI scribe software for clinical note transcription with the patient, who gave verbal consent to proceed. Patient's husband provides history today as she has dx of dementia.  History of Present Illness   Joanne Brock, a patient with a history of diverticulosis, presents with diffuse abdominal pain of six days duration. The pain, which has been gradually worsening, is not localized to any specific area. She has not had a normal bowel movement in several days, but has passed small amounts of stool. She has not experienced any vomiting, fever, or loss of appetite. She has been able to eat and her appetite was reportedly increased at the onset  of the abdominal pain. However, eating often triggers cramping, which also occurs hours after eating. The pain has progressed to the point where it affects her mobility, causing stiffness and difficulty walking. Despite the discomfort, she has not exhibited any signs of severe distress such as doubling over in pain or inability to walk.       Past Medical History:  Diagnosis Date   Allergy    Alzheimer's dementia (HCC)    Cancer (HCC)    basal cell and squamous cell skin cancer   History of skin cancer    squamous and basal x 8 in 2018. Dr. Sharyn Lull. Had been with Dr. Danella Deis.    Kidney stone    x1   Migraines    Aura- smell, spots. once a month- sumatriptan. topamax 50mg  once a day prophylaxis.    Osteopenia     Past Surgical History:  Procedure Laterality Date   COLONOSCOPY     TONSILLECTOMY Bilateral 1960    Family History  Problem Relation Age of Onset   Hypertension Mother    Stroke Mother        TIAs- declined over years and stroke eventually. died 44   Other Father        decline after ? heat stroke. died around 43   Stroke Sister        66. nonsmoker.    Ulcerative colitis Daughter    Colon cancer Neg Hx    Colon  polyps Neg Hx    Esophageal cancer Neg Hx    Stomach cancer Neg Hx    Rectal cancer Neg Hx     Social History   Tobacco Use   Smoking status: Never   Smokeless tobacco: Never  Vaping Use   Vaping status: Never Used  Substance Use Topics   Alcohol use: No    Alcohol/week: 0.0 standard drinks of alcohol   Drug use: No     No Known Allergies  Review of Systems NEGATIVE UNLESS OTHERWISE INDICATED IN HPI      Objective:     BP 102/70 (BP Location: Right Arm, Patient Position: Sitting, Cuff Size: Small)   Pulse 95   Temp (!) 97.5 F (36.4 C) (Temporal)   Resp 16   Ht 5\' 5"  (1.651 m)   Wt 108 lb 9.6 oz (49.3 kg)   SpO2 97%   BMI 18.07 kg/m   Wt Readings from Last 3 Encounters:  05/21/23 108 lb 9.6 oz (49.3 kg)  02/16/23 107 lb  6.4 oz (48.7 kg)  07/21/22 99 lb 12.8 oz (45.3 kg)    BP Readings from Last 3 Encounters:  05/21/23 102/70  02/16/23 121/66  07/21/22 128/77     Physical Exam Vitals and nursing note (thin) reviewed.  Constitutional:      General: She is not in acute distress.    Appearance: Normal appearance. She is normal weight. She is not ill-appearing or toxic-appearing.  HENT:     Head: Normocephalic and atraumatic.     Right Ear: Tympanic membrane, ear canal and external ear normal.     Left Ear: Tympanic membrane, ear canal and external ear normal.     Nose: Nose normal.     Mouth/Throat:     Mouth: Mucous membranes are moist.  Eyes:     Extraocular Movements: Extraocular movements intact.     Conjunctiva/sclera: Conjunctivae normal.     Pupils: Pupils are equal, round, and reactive to light.  Cardiovascular:     Rate and Rhythm: Normal rate and regular rhythm.     Pulses: Normal pulses.     Heart sounds: Normal heart sounds.  Pulmonary:     Effort: Pulmonary effort is normal.     Breath sounds: Normal breath sounds.  Abdominal:     General: Abdomen is flat. Bowel sounds are normal.     Palpations: There is no hepatomegaly or mass.     Tenderness: There is generalized abdominal tenderness. There is no right CVA tenderness, left CVA tenderness, guarding or rebound. Negative signs include McBurney's sign, psoas sign and obturator sign.  Musculoskeletal:        General: Normal range of motion.     Cervical back: Normal range of motion and neck supple.  Skin:    General: Skin is warm and dry.     Findings: No rash.  Neurological:     General: No focal deficit present.     Mental Status: She is alert and oriented to person, place, and time.  Psychiatric:        Mood and Affect: Mood normal.        Behavior: Behavior normal.        Thought Content: Thought content normal.        Judgment: Judgment normal.           Time Spent: 31 minutes of total time was spent on the date  of the encounter performing the following actions: chart review prior to seeing  the patient, obtaining history, performing a medically necessary exam, counseling on the treatment plan, placing orders, and documenting in our EHR.       Alaa Eyerman M Dashawn Bartnick, PA-C

## 2023-05-22 ENCOUNTER — Ambulatory Visit (INDEPENDENT_AMBULATORY_CARE_PROVIDER_SITE_OTHER)
Admission: RE | Admit: 2023-05-22 | Discharge: 2023-05-22 | Disposition: A | Discharge status: Home / Self Care | Payer: Medicare Other | Source: Ambulatory Visit | Attending: Physician Assistant | Admitting: Physician Assistant

## 2023-05-22 DIAGNOSIS — R109 Unspecified abdominal pain: Secondary | ICD-10-CM | POA: Diagnosis not present

## 2023-05-22 DIAGNOSIS — K5900 Constipation, unspecified: Secondary | ICD-10-CM | POA: Diagnosis not present

## 2023-05-22 DIAGNOSIS — I878 Other specified disorders of veins: Secondary | ICD-10-CM | POA: Diagnosis not present

## 2023-05-22 DIAGNOSIS — R1084 Generalized abdominal pain: Secondary | ICD-10-CM | POA: Diagnosis not present

## 2023-05-22 LAB — URINE CULTURE
MICRO NUMBER:: 15825675
SPECIMEN QUALITY:: ADEQUATE

## 2023-05-23 ENCOUNTER — Emergency Department (HOSPITAL_BASED_OUTPATIENT_CLINIC_OR_DEPARTMENT_OTHER)
Admission: EM | Admit: 2023-05-23 | Discharge: 2023-05-23 | Disposition: A | Discharge status: Home / Self Care | Payer: Medicare Other | Attending: Emergency Medicine | Admitting: Emergency Medicine

## 2023-05-23 ENCOUNTER — Encounter: Payer: Self-pay | Admitting: Family Medicine

## 2023-05-23 ENCOUNTER — Encounter (HOSPITAL_BASED_OUTPATIENT_CLINIC_OR_DEPARTMENT_OTHER): Payer: Self-pay | Admitting: Emergency Medicine

## 2023-05-23 ENCOUNTER — Other Ambulatory Visit: Payer: Self-pay

## 2023-05-23 DIAGNOSIS — Z85828 Personal history of other malignant neoplasm of skin: Secondary | ICD-10-CM | POA: Diagnosis not present

## 2023-05-23 DIAGNOSIS — G309 Alzheimer's disease, unspecified: Secondary | ICD-10-CM | POA: Insufficient documentation

## 2023-05-23 DIAGNOSIS — K5900 Constipation, unspecified: Secondary | ICD-10-CM | POA: Insufficient documentation

## 2023-05-23 MED ORDER — FLEET ENEMA RE ENEM
1.0000 | ENEMA | Freq: Once | RECTAL | Status: AC
  Administered 2023-05-23: 1 via RECTAL
  Filled 2023-05-23: qty 1

## 2023-05-23 MED ORDER — FLEET ENEMA RE ENEM: 1.0000 | ENEMA | Freq: Once | RECTAL | 0 refills | Status: AC

## 2023-05-23 NOTE — ED Provider Notes (Signed)
[place] [facility] Provider Note   CSN: [id number] Arrival date & time: 05/23/23  1532     History  Chief Complaint  Patient presents with   Constipation    Joanne Brock is a 72 y.o. female history of Alzheimer's, vitamin D deficiency, skin cancer presented with constipation for the past week.  Patient is unable to give history due to her Alzheimer's and so husband is able to provide history.  Has been states that patient was seen by primary care and was told that she has moderate stool but had reassuring labs.  They have tried mag citrate to no relief.  Last bowel movement known was a week ago.  Patient has been eating without issue and husband denies nausea vomiting, fevers.  Level 5 caveat Alzheimer's  Home Medications Prior to Admission medications   Medication Sig Start Date End Date Taking? Authorizing Provider  sodium phosphate (FLEET) ENEM Place 133 mLs (1 enema total) rectally once for 1 dose. 05/23/23 05/23/23 Yes Netta Corrigan, PA-C  memantine (NAMENDA) 10 MG tablet Take 1 tablet twice a day 02/16/23   Van Clines, MD  Multiple Vitamin (MULTIVITAMIN) tablet Take 1 tablet by mouth daily.    [provider]  ondansetron (ZOFRAN-ODT) 4 MG disintegrating tablet DISSOLVE 1 TABLET BY MOUTH EVERY 8 HOURS AS NEEDED FOR NAUSEA AND/OR VOMITING 05/08/22   Shelva Majestic, MD  rizatriptan (MAXALT) 10 MG tablet TAKE 1 TABLET BY MOUTH AS NEEDED FOR MIGRAINE. MAY REPEAT IN 2 HOURS IF NEEDED 05/23/21   Shelva Majestic, MD      Allergies    Patient has no known allergies.    Review of Systems   Review of Systems  Gastrointestinal:  Positive for constipation.    Physical Exam Updated Vital Signs BP 116/72   Pulse 75   Temp 98.1 F (36.7 C) (Oral)   Resp 16   Wt 50 kg   SpO2 96%   BMI 18.34 kg/m  Physical Exam Constitutional:      General: She is not in acute distress. Cardiovascular:     Rate and Rhythm: Normal  rate and regular rhythm.     Pulses: Normal pulses.     Heart sounds: Normal heart sounds.  Pulmonary:     Effort: Pulmonary effort is normal. No respiratory distress.     Breath sounds: Normal breath sounds.  Abdominal:     General: There is no distension.     Palpations: Abdomen is soft.     Tenderness: There is no abdominal tenderness. There is no guarding or rebound.  Skin:    General: Skin is warm and dry.  Neurological:     Mental Status: She is alert. Mental status is at baseline.     ED Results / Procedures / Treatments   Labs (all labs ordered are listed, but only abnormal results are displayed) Labs Reviewed - No data to display  EKG None  Radiology DG Abd 2 Views  Result Date: 05/22/2023 CLINICAL DATA:  Acute generalized abdominal pain for 6 days, constipation. EXAM: ABDOMEN - 2 VIEW COMPARISON:  March 14, 2022. FINDINGS: No abnormal bowel dilatation is noted. Moderate amount of stool seen throughout the colon. There is no evidence of free air. Phleboliths are noted in the pelvis. IMPRESSION: Moderate stool burden.  No abnormal bowel dilatation. Electronically Signed   By: Lupita Raider M.D.   On: 05/22/2023 10:13    Procedures Procedures  Medications Ordered in ED Medications  sodium phosphate (FLEET) enema 1 enema (1 enema Rectal Given 05/23/23 1901)    ED Course/ Medical Decision Making/ A&P                                 Medical Decision Making Risk OTC drugs.   Joanne Brock 72 y.o. presented today for constipation. Working DDx that I considered at this time includes, but not limited to, constipation secondary to drug use, dehydration, fecal impaction, SBO/LBO, metastasis, volvulus, anal fissure/hemorrhoids, electrolyte abnormalities, colitis.  R/o DDx: constipation secondary to drug use, dehydration, fecal impaction, SBO/LBO, metastasis, volvulus, anal fissure/hemorrhoids, electrolyte abnormalities, colitis: These are considered less likely  due to history of present illness and physical exam findings  Review of prior external notes: 05/21/2023 office visit  Unique Tests and My Interpretation: None  Social Determinants of Health: none  Discussion with Independent Historian:  Husband  Discussion of Management of Tests: None  Risk: Medium: prescription drug management  Risk Stratification Score: None  Plan: On exam patient was in no acute distress stable vitals.  Patient physical exam was unremarkable.  Husband was present to provide history and states she has not had a bowel movement in roughly a week and that they have tried a few laxative agents.  Will try enema and see if patient had bowel movement here.  Abdominal labs were ordered however husband states they had labs drawn a few days ago and does not feel that this is necessary and so these were discontinued.  Patient is able to have 2 bowel movements here in the ED after the enema was placed.  Patient and husband would like to be discharged at this time to follow-up outpatient but do want 1 more enema at home in case they need it which is reasonable.  I encouraged patient to drink plenty of fluids and to have high-fiber diet including 1 cup of bran daily or Metamucil 1 to 2 teaspoons 3 times daily with plenty of exercise.  I also encouraged patient to use Dulcolax, magnesium citrate, MiraLAX if symptoms do persist.  Patient was given return precautions. Patient stable for discharge at this time.  Patient verbalized understanding of plan.  This chart was dictated using voice recognition software.  Despite best efforts to proofread,  errors can occur which can change the documentation meaning.        Final Clinical Impression(s) / ED Diagnoses Final diagnoses:  Constipation, unspecified constipation type    Rx / DC Orders ED Discharge Orders          Ordered    sodium phosphate (FLEET) ENEM   Once        05/23/23 2000              Netta Corrigan,  PA-C 05/23/23 2002    Sloan Leiter, DO 05/26/23 0715

## 2023-05-23 NOTE — ED Notes (Signed)
Patient's husband stated they just had labs done 2 days ago for same complaint.

## 2023-05-23 NOTE — ED Notes (Signed)
Patient had a large bowel movement after the fleets enema.

## 2023-05-23 NOTE — Discharge Instructions (Signed)
Please follow-up with your care provider in regards recent ER visit.  I have prescribed you 1 enema that he may use tomorrow afternoon however you may continue using the mag citrate as prescribed once daily.  Please drink plenty of fluids and eat food as tolerated follow-up with your primary care provider.  Symptoms change or worsen please return to the ER.

## 2023-05-23 NOTE — ED Triage Notes (Signed)
Constipation x 1 week with lower abdominal pain , seen at PCP , normal UA and abdomen Xray showing stool impaction . No relief despite laxatives .  Spouse is the historian due to Pt Hx alzheimer Denies [place] .

## 2023-05-28 ENCOUNTER — Ambulatory Visit (INDEPENDENT_AMBULATORY_CARE_PROVIDER_SITE_OTHER): Payer: Medicare Other

## 2023-05-28 VITALS — Wt 110.0 lb

## 2023-05-28 DIAGNOSIS — Z0000 Encounter for general adult medical examination without abnormal findings: Secondary | ICD-10-CM | POA: Diagnosis not present

## 2023-05-28 NOTE — Patient Instructions (Signed)
Joanne Brock , Thank you for taking time to come for your Medicare Wellness Visit. I appreciate your ongoing commitment to your health goals. Please review the following plan we discussed and let me know if I can assist you in the future.   Referrals/Orders/Follow-Ups/Clinician Recommendations: Aim for 30 minutes of exercise or brisk walking, 6-8 glasses of water, and 5 servings of fruits and vegetables each day.   This is a list of the screening recommended for you and due dates:  Health Maintenance  Topic Date Due   Mammogram  01/10/2023   Flu Shot  03/10/2023   Medicare Annual Wellness Visit  05/23/2023   Colon Cancer Screening  05/22/2028   DTaP/Tdap/Td vaccine (3 - Td or Tdap) 12/11/2032   Pneumonia Vaccine  Completed   DEXA scan (bone density measurement)  Completed   COVID-19 Vaccine  Completed   Hepatitis C Screening  Completed   Zoster (Shingles) Vaccine  Completed   HPV Vaccine  Aged Out    Advanced directives: (Copy Requested) Please bring a copy of your health care power of attorney and living will to the office to be added to your chart at your convenience.  Next Medicare Annual Wellness Visit scheduled for next year: Yes

## 2023-05-28 NOTE — Progress Notes (Signed)
Subjective:   Joanne Brock is a 72 y.o. female who presents for Medicare Annual (Subsequent) preventive examination.  Visit Complete: Virtual I connected with  Joanne Brock on 05/28/23 by a audio enabled telemedicine application and verified that I am speaking with the correct person using two identifiers.  Patient Location: Home  Provider Location: Office/Clinic  I discussed the limitations of evaluation and management by telemedicine. The patient expressed understanding and agreed to proceed.  Vital Signs: Because this visit was a virtual/telehealth visit, some criteria may be missing or patient reported. Any vitals not documented were not able to be obtained and vitals that have been documented are patient reported.    Cardiac Risk Factors include: advanced age (>78men, >61 women);dyslipidemia     Objective:    Today's Vitals   05/28/23 1049  Weight: 110 lb (49.9 kg)   Body mass index is 18.3 kg/m.     05/23/2023    4:03 PM 02/16/2023    8:31 AM 07/21/2022    8:28 AM 05/22/2022   10:57 AM 12/27/2021    8:19 AM 05/09/2021   11:11 AM 04/26/2020   10:24 AM  Advanced Directives  Does Patient Have a Medical Advance Directive? Yes Yes Yes Yes Yes Yes Yes  Type of Advance Directive Living will;Healthcare Power of [employer] Power of [place];Living will Living will;Healthcare Power of [employer] Power of [place];Living will Healthcare Power of [place];Living will;Out of facility DNR (pink MOST or yellow form) Healthcare Power of [employer] of [place];Living will  Copy of Healthcare Power of Attorney in Chart?    No - copy requested  No - copy requested No - copy requested    Current Medications (verified) Outpatient Encounter Medications as of 05/28/2023  Medication Sig   memantine (NAMENDA) 10 MG tablet Take 1 tablet twice a day   Multiple Vitamin (MULTIVITAMIN) tablet Take 1 tablet by mouth daily.   ondansetron (ZOFRAN-ODT) 4 MG  disintegrating tablet DISSOLVE 1 TABLET BY MOUTH EVERY 8 HOURS AS NEEDED FOR NAUSEA AND/OR VOMITING   rizatriptan (MAXALT) 10 MG tablet TAKE 1 TABLET BY MOUTH AS NEEDED FOR MIGRAINE. MAY REPEAT IN 2 HOURS IF NEEDED   No facility-administered encounter medications on file as of 05/28/2023.    Allergies (verified) Patient has no known allergies.   History: Past Medical History:  Diagnosis Date   Allergy    Alzheimer's dementia (HCC)    Cancer (HCC)    basal cell and squamous cell skin cancer   History of skin cancer    squamous and basal x 8 in 2018. Dr. Sharyn Lull. Had been with Dr. Danella Deis.    Kidney stone    x1   Migraines    Aura- smell, spots. once a month- sumatriptan. topamax 50mg  once a day prophylaxis.    Osteopenia    Past Surgical History:  Procedure Laterality Date   COLONOSCOPY     TONSILLECTOMY Bilateral 1960   Family History  Problem Relation Age of Onset   Hypertension Mother    Stroke Mother        TIAs- declined over years and stroke eventually. died 56   Other Father        decline after ? heat stroke. died around 83   Stroke Sister        63. nonsmoker.    Ulcerative colitis Daughter    Colon cancer Neg Hx    Colon polyps Neg Hx    Esophageal cancer Neg Hx  Stomach cancer Neg Hx    Rectal cancer Neg Hx    Social History   Socioeconomic History   Marital status: Married    Spouse name: Not on file   Number of children: Not on file   Years of education: Not on file   Highest education level: Not on file  Occupational History   Occupation: retired  Tobacco Use   Smoking status: Never   Smokeless tobacco: Never  Vaping Use   Vaping status: Never Used  Substance and Sexual Activity   Alcohol use: No    Alcohol/week: 0.0 standard drinks of alcohol   Drug use: No   Sexual activity: Yes  Other Topics Concern   Not on file  Social History Narrative   Married over 40 years in 2018. 1 daughter 39 years old in 2023. 1 granddaughter born June  2023.    Had to spend great deal of time caring for aging parents- now passed.       Undergrad at [employer].    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 [employer]      Hobbies: travel   Right Handed   One Story Home   Does drink caffeine occasionally   Social Drivers of Health   Financial Resource Strain: Low Risk  (05/28/2023)   Overall Financial Resource Strain (CARDIA)    Difficulty of Paying Living Expenses: Not hard at all  Food Insecurity: No Food Insecurity (05/28/2023)   Hunger Vital Sign    Worried About Running Out of Food in the Last Year: Never true    Ran Out of Food in the Last Year: Never true  Transportation Needs: No Transportation Needs (05/28/2023)   PRAPARE - Administrator, Civil Service (Medical): No    Lack of Transportation (Non-Medical): No  Physical Activity: Sufficiently Active (05/28/2023)   Exercise Vital Sign    Days of Exercise per Week: 5 days    Minutes of Exercise per Session: 30 min  Stress: No Stress Concern Present (05/28/2023)   [employer] of Occupational Health - Occupational Stress Questionnaire    Feeling of Stress : Not at all  Social Connections: Moderately Integrated (05/28/2023)   Social Connection and Isolation Panel [NHANES]    Frequency of Communication with Friends and Family: More than three times a week    Frequency of Social Gatherings with Friends and Family: More than three times a week    Attends Religious Services: More than 4 times per year    Active Member of [employer] or Organizations: No    Attends Engineer, structural: Never    Marital Status: Married    Tobacco Counseling Counseling given: Not Answered   Clinical Intake:  Pre-visit preparation completed: Yes  Pain : No/denies pain     BMI - recorded: 18.3 Nutritional Status: BMI <19  Underweight Diabetes: No  How often do you need to have someone help you when you read instructions, pamphlets, or other written materials  from your doctor or pharmacy?: 2 - Rarely (husband will assist as needed)  Interpreter Needed?: No  Information entered by :: Lanier Ensign, LPN   Activities of Daily Living    05/28/2023   10:50 AM  In your present state of health, do you have any difficulty performing the following activities:  Hearing? 0  Vision? 0  Difficulty concentrating or making decisions? 1  Comment husband assist with helping her remember  Walking or climbing stairs? 0  Dressing or bathing?  0  Doing errands, shopping? 0  Preparing Food and eating ? N  Using the Toilet? N  In the past six months, have you accidently leaked urine? N  Do you have problems with loss of bowel control? N  Managing your Medications? N  Managing your Finances? Y  Comment husband  Housekeeping or managing your Housekeeping? Y  Comment husband    Patient Care Team: Shelva Majestic, MD as PCP - General (Family Medicine) Sharyn Lull, Elvin So, MD as Referring Physician (Dermatology) Huel Cote, MD as Consulting Physician (Obstetrics and Gynecology) Holli Humbles, MD as Referring Physician (Ophthalmology) Van Clines, MD as Consulting Physician (Neurology)  Indicate any recent Medical Services you may have received from other than [facility] providers in the past year (date may be approximate).     Assessment:   This is a routine wellness examination for [place].  Hearing/Vision screen Hearing Screening - Comments:: Pt denies any hearing issues  Vision Screening - Comments:: Pt follows up with [facility]    Goals Addressed   None    Depression Screen    05/28/2023   10:53 AM 05/22/2022   10:55 AM 05/09/2021   11:10 AM 07/20/2020    1:10 PM 04/26/2020   10:22 AM 04/23/2020   11:24 AM 10/21/2019    1:16 PM  PHQ 2/9 Scores  PHQ - 2 Score 0 0 0 0 0 0 0  PHQ- 9 Score      0     Fall Risk    05/28/2023   10:54 AM 02/16/2023    8:31 AM 07/21/2022    8:28 AM 05/22/2022   10:57 AM  12/27/2021    8:19 AM  Fall Risk   Falls in the past year? 0 0 0 0 0  Number falls in past yr: 0 0 0 0 0  Injury with Fall? 0 0 0 0 0  Risk for fall due to : No Fall Risks      Follow up Falls prevention discussed Falls evaluation completed Falls evaluation completed Falls prevention discussed     MEDICARE RISK AT HOME: Medicare Risk at Home Any stairs in or around the home?: Yes If so, are there any without handrails?: No Home free of loose throw rugs in walkways, pet beds, electrical cords, etc?: Yes Adequate lighting in your home to reduce risk of falls?: Yes Life alert?: Yes Use of a cane, walker or w/c?: No Grab bars in the bathroom?: Yes Shower chair or bench in shower?: No Elevated toilet seat or a handicapped toilet?: No  TIMED UP AND GO:  Was the test performed?  No    Cognitive Function:    05/28/2023   11:13 AM 12/27/2021    8:00 AM 10/21/2019    2:09 PM  MMSE - Mini Mental State Exam  Not completed: Unable to complete    Orientation to time  1 2  Orientation to Place  2 4  Registration  3 3  Attention/ Calculation  0 3  Recall  0 3  Language- name 2 objects  0 1  Language- repeat  1 1  Language- follow 3 step command  1 3  Language- read & follow direction  1 1  Write a sentence  0 1  Copy design  0 1  Total score  9 23      02/25/2020    9:00 AM  Montreal Cognitive Assessment   Visuospatial/ Executive (0/5) 2  Naming (0/3) 2  Attention: Read list of digits (0/2) 1  Attention: Read list of letters (0/1) 1  Attention: Serial 7 subtraction starting at 100 (0/3) 0  Language: Repeat phrase (0/2) 2  Language : Fluency (0/1) 0  Abstraction (0/2) 1  Delayed Recall (0/5) 0  Orientation (0/6) 5  Total 14  Adjusted Score (based on education) 14      05/09/2021   11:15 AM  6CIT Screen  What Year? 4 points  What month? 3 points  What time? 3 points  Count back from 20 0 points  Months in reverse 4 points  Repeat phrase 10 points  Total Score 24  points    Immunizations Immunization History  Administered Date(s) Administered   Fluad Quad(high Dose 65+) 03/15/2020   Hepatitis A 11/05/2009   Influenza, High Dose Seasonal PF 03/19/2018, 03/24/2019, 03/11/2021, 03/09/2022   Influenza,inj,Quad PF,6+ Mos 04/03/2017   Influenza-Unspecified 03/03/2009, 03/09/2010, 03/14/2011, 03/19/2012, 03/31/2013, 04/02/2015, 04/11/2016, 04/01/2023   MMR 11/12/2017   Moderna Covid-19 Fall Seasonal Vaccine 53yrs & older 03/22/2022   PFIZER Comirnaty(Gray Top)Covid-19 Tri-Sucrose Vaccine 10/28/2020   PFIZER(Purple Top)SARS-COV-2 Vaccination 07/01/2019, 07/21/2019, 04/15/2020, 04/25/2021   Pfizer Covid-19 Vaccine Bivalent Booster 57yrs & up 04/24/2021   Pneumococcal Conjugate-13 11/12/2017   Pneumococcal Polysaccharide-23 04/23/2019   Rsv, Bivalent, Protein Subunit Rsvpref,pf Verdis Frederickson) 02/09/2022   Tdap 06/12/2012, 12/12/2022   Zoster Recombinant(Shingrix) 04/29/2019, 09/30/2019   Zoster, Live 03/20/2013    TDAP status: Up to date  Flu Vaccine status: Up to date  Pneumococcal vaccine status: Up to date  Covid-19 vaccine status: Information provided on how to obtain vaccines.   Qualifies for Shingles Vaccine? Yes   Zostavax completed Yes   Shingrix Completed?: Yes  Screening Tests Health Maintenance  Topic Date Due   MAMMOGRAM  01/10/2023   Medicare Annual Wellness (AWV)  05/27/2024   Colonoscopy  05/22/2028   DTaP/Tdap/Td (3 - Td or Tdap) 12/11/2032   Pneumonia Vaccine 34+ Years old  Completed   INFLUENZA VACCINE  Completed   DEXA SCAN  Completed   COVID-19 Vaccine  Completed   Hepatitis C Screening  Completed   Zoster Vaccines- Shingrix  Completed   HPV VACCINES  Aged Out    Health Maintenance  Health Maintenance Due  Topic Date Due   MAMMOGRAM  01/10/2023    Colorectal cancer screening: Type of screening: Colonoscopy. Completed 05/22/18. Repeat every 10 years  Pt stated completed via Dr Senaida Ores office requested records    Bone Density status: Completed 11/29/22. Results reflect: Bone density results: OSTEOPENIA. Repeat every 2 years.  Additional Screening:  Hepatitis C Screening: ; Completed 04/16/17   Vision Screening: Recommended annual ophthalmology exams for early detection of glaucoma and other disorders of the eye. Is the patient up to date with their annual eye exam?  Yes  Who is the provider or what is the name of the office in which the patient attends annual eye exams? [facility]  If pt is not established with a provider, would they like to be referred to a provider to establish care? No .   Dental Screening: Recommended annual dental exams for proper oral hygiene   Community Resource Referral / Chronic Care Management: CRR required this visit?  No   CCM required this visit?  No     Plan:     I have personally reviewed and noted the following in the patient's chart:   Medical and social history Use of alcohol, tobacco or illicit drugs  Current medications and supplements including  opioid prescriptions. Patient is not currently taking opioid prescriptions. Functional ability and status Nutritional status Physical activity Advanced directives List of other physicians Hospitalizations, surgeries, and ER visits in previous 12 months Vitals Screenings to include cognitive, depression, and falls Referrals and appointments  In addition, I have reviewed and discussed with patient certain preventive protocols, quality metrics, and best practice recommendations. A written personalized care plan for preventive services as well as general preventive health recommendations were provided to patient.     Marzella Schlein, LPN   16/03/9603   After Visit Summary: (MyChart) Due to this being a telephonic visit, the after visit summary with patients personalized plan was offered to patient via MyChart   Nurse Notes: Pt Husband declined cognition testing at this time.

## 2023-06-07 NOTE — Telephone Encounter (Signed)
Pt has been scheduled for 12/30 @ 11 am w/ Alyssa.

## 2023-06-10 ENCOUNTER — Encounter (HOSPITAL_BASED_OUTPATIENT_CLINIC_OR_DEPARTMENT_OTHER): Payer: Self-pay

## 2023-06-10 ENCOUNTER — Encounter: Payer: Self-pay | Admitting: Physician Assistant

## 2023-06-10 ENCOUNTER — Other Ambulatory Visit: Payer: Self-pay

## 2023-06-10 ENCOUNTER — Emergency Department (HOSPITAL_BASED_OUTPATIENT_CLINIC_OR_DEPARTMENT_OTHER)
Admission: EM | Admit: 2023-06-10 | Discharge: 2023-06-10 | Disposition: A | Discharge status: Home / Self Care | Payer: Medicare Other

## 2023-06-10 DIAGNOSIS — F0280 Dementia in other diseases classified elsewhere without behavioral disturbance: Secondary | ICD-10-CM | POA: Insufficient documentation

## 2023-06-10 DIAGNOSIS — K5900 Constipation, unspecified: Secondary | ICD-10-CM

## 2023-06-10 DIAGNOSIS — Z85828 Personal history of other malignant neoplasm of skin: Secondary | ICD-10-CM | POA: Diagnosis not present

## 2023-06-10 DIAGNOSIS — G309 Alzheimer's disease, unspecified: Secondary | ICD-10-CM | POA: Diagnosis not present

## 2023-06-10 DIAGNOSIS — R1032 Left lower quadrant pain: Secondary | ICD-10-CM | POA: Diagnosis not present

## 2023-06-10 LAB — COMPREHENSIVE METABOLIC PANEL
ALT: 14 U/L (ref 0–44)
AST: 26 U/L (ref 15–41)
Albumin: 4.5 g/dL (ref 3.5–5.0)
Alkaline Phosphatase: 74 U/L (ref 38–126)
Anion gap: 11 (ref 5–15)
BUN: 11 mg/dL (ref 8–23)
CO2: 28 mmol/L (ref 22–32)
Calcium: 10.1 mg/dL (ref 8.9–10.3)
Chloride: 100 mmol/L (ref 98–111)
Creatinine, Ser: 1.05 mg/dL — ABNORMAL HIGH (ref 0.44–1.00)
GFR, Estimated: 57 mL/min — ABNORMAL LOW (ref >60)
Glucose, Bld: 103 mg/dL — ABNORMAL HIGH (ref 70–99)
Potassium: 3.8 mmol/L (ref 3.5–5.1)
Sodium: 139 mmol/L (ref 135–145)
Total Bilirubin: 0.9 mg/dL (ref <1.2)
Total Protein: 7.5 g/dL (ref 6.5–8.1)

## 2023-06-10 LAB — CBC
HCT: 40.9 % (ref 36.0–46.0)
Hemoglobin: 13.9 g/dL (ref 12.0–15.0)
MCH: 29.4 pg (ref 26.0–34.0)
MCHC: 34 g/dL (ref 30.0–36.0)
MCV: 86.7 fL (ref 80.0–100.0)
Platelets: 253 10*3/uL (ref 150–400)
RBC: 4.72 MIL/uL (ref 3.87–5.11)
RDW: 12.1 % (ref 11.5–15.5)
WBC: 6.6 10*3/uL (ref 4.0–10.5)
nRBC: 0 % (ref 0.0–0.2)

## 2023-06-10 LAB — LIPASE, BLOOD: Lipase: 45 U/L (ref 11–51)

## 2023-06-10 MED ORDER — FLEET ENEMA RE ENEM
1.0000 | ENEMA | Freq: Once | RECTAL | Status: AC
  Administered 2023-06-10: 1 via RECTAL
  Filled 2023-06-10: qty 1

## 2023-06-10 NOTE — ED Provider Notes (Signed)
[facility] Provider Note   CSN: [id number] Arrival date & time: 06/10/23  1022     History  Chief Complaint  Patient presents with   Constipation    Joanne Brock is a 72 y.o. female.   Constipation   72 year old female presents emergency department accompanied by husband who is primary historian.  Patient with history of Alzheimer's dementia and unable to provide detailed in history.  Husband at bedside is primary caregiver.  Husband states that patient has had no bowel movement in the past 4 days.  States that he has been able to "harvest stool" by inserting finger into rectum but patient has had no independent bowel movement.  Had similar symptoms a couple of weeks ago and went to the emergency department with symptoms resolved with phosphate enema.  Husband has been giving patient MiraLAX as well as bran flakes in the morning which seem to not have been helping.  Patient complaining of left lower abdominal pain.  Has been states the pain typically is worse after she eats and relieved with rest.  Currently without any abdominal pain.  Denies any fever, chills, nausea, vomiting, chest pain or shortness of breath, urinary symptoms.  Past medical history significant for alteration dementia, skin cancer, nephrolithiasis, migraine, allergy, osteopenia, hyperlipidemia  Home Medications Prior to Admission medications   Medication Sig Start Date End Date Taking? Authorizing Provider  memantine (NAMENDA) 10 MG tablet Take 1 tablet twice a day 02/16/23   Van Clines, MD  Multiple Vitamin (MULTIVITAMIN) tablet Take 1 tablet by mouth daily.    [provider]  ondansetron (ZOFRAN-ODT) 4 MG disintegrating tablet DISSOLVE 1 TABLET BY MOUTH EVERY 8 HOURS AS NEEDED FOR NAUSEA AND/OR VOMITING 05/08/22   Shelva Majestic, MD  rizatriptan (MAXALT) 10 MG tablet TAKE 1 TABLET BY MOUTH AS NEEDED FOR MIGRAINE. MAY REPEAT IN 2 HOURS IF NEEDED  05/23/21   Shelva Majestic, MD      Allergies    Patient has no known allergies.    Review of Systems   Review of Systems  Gastrointestinal:  Positive for constipation.  All other systems reviewed and are negative.   Physical Exam Updated Vital Signs BP (!) 141/75   Pulse 80   Temp 98.3 F (36.8 C) (Oral)   Resp 18   SpO2 99%  Physical Exam Vitals and nursing note reviewed.  Constitutional:      General: She is not in acute distress.    Appearance: She is well-developed.  HENT:     Head: Normocephalic and atraumatic.  Eyes:     Conjunctiva/sclera: Conjunctivae normal.  Cardiovascular:     Rate and Rhythm: Normal rate and regular rhythm.     Heart sounds: No murmur heard. Pulmonary:     Effort: Pulmonary effort is normal. No respiratory distress.     Breath sounds: Normal breath sounds.  Abdominal:     Palpations: Abdomen is soft.     Tenderness: There is abdominal tenderness. There is no right CVA tenderness, left CVA tenderness or guarding.     Comments: Left lower quadrant abdominal tenderness.  Musculoskeletal:        General: No swelling.     Cervical back: Neck supple.  Skin:    General: Skin is warm and dry.     Capillary Refill: Capillary refill takes less than 2 seconds.  Neurological:     Mental Status: She is alert.  Psychiatric:  Mood and Affect: Mood normal.     ED Results / Procedures / Treatments   Labs (all labs ordered are listed, but only abnormal results are displayed) Labs Reviewed  COMPREHENSIVE METABOLIC PANEL - Abnormal; Notable for the following components:      Result Value   Glucose, Bld 103 (*)    Creatinine, Ser 1.05 (*)    GFR, Estimated 57 (*)    All other components within normal limits  CBC  LIPASE, BLOOD    EKG None  Radiology No results found.  Procedures Procedures    Medications Ordered in ED Medications  sodium phosphate (FLEET) enema 1 enema (1 enema Rectal Given 06/10/23 1105)    ED Course/  Medical Decision Making/ A&P                                 Medical Decision Making Amount and/or Complexity of Data Reviewed Labs: ordered.  Risk OTC drugs.   This patient presents to the ED for concern of abdominal pain, this involves an extensive number of treatment options, and is a complaint that carries with it a high risk of complications and morbidity.  The differential diagnosis includes constipation, SBO/LBO, diverticulitis, cystitis, pyelonephritis, nephrolithiasis, mesenteric ischemia, volvulus, CBD pathology, cholecystitis, gastritis, PUD, other   Co morbidities that complicate the patient evaluation  See HPI   Additional history obtained:  Additional history obtained from EMR External records from outside source obtained and reviewed including hospital records   Lab Tests:  I Ordered, and personally interpreted labs.  The pertinent results include: No leukocytosis.  No evidence of anemia.  Platelets within range.  No electrolyte abnormalities.  Creatinine at baseline 1.05.  No transaminitis.  Lipase within normal limits.   Imaging Studies ordered:  N/a   Cardiac Monitoring: / EKG:  The patient was maintained on a cardiac monitor.  I personally viewed and interpreted the cardiac monitored which showed an underlying rhythm of: Sinus rhythm   Consultations Obtained:  N/a   Problem List / ED Course / Critical interventions / Medication management  Constipation, left lower abdominal pain I ordered medication including Fleet enema  Reevaluation of the patient after these medicines showed that the patient improved I have reviewed the patients home medicines and have made adjustments as needed   Social Determinants of Health:  Denies tobacco, illicit drug use   Test / Admission - Considered:  Constipation, left lower abdominal pain Vitals signs within normal range and stable throughout visit. Laboratory/imaging studies significant for: See  above 72 year old female presents emergency department, by husband with complaints of constipation left lower abdominal pain.  Symptoms present for the past 3 to 4 days.  On exam, tenderness left lower abdomen.  Shared decision made conversation was had with patient and husband regarding obtaining labs and imaging as well as treating patient's constipation.  Patient and husband elected for labs as well as trial of Fleet enema given that she noted resolution of symptoms 2+ weeks ago with "the exact same presentation."  Labs reassuring.  Treated with Fleet enema with large bowel movement subsequently.  Repeat abdominal exam benign.  Will recommend further symptomatic therapy as described in AVS.  Patient has close follow-up with PCP in outpatient setting tomorrow for reevaluation.  Strict return precautions discussed at length. Worrisome signs and symptoms were discussed with the patient, and the patient acknowledged understanding to return to the ED if noticed. Patient was stable  upon discharge.          Final Clinical Impression(s) / ED Diagnoses Final diagnoses:  Constipation, unspecified constipation type  Left lower quadrant abdominal pain    Rx / DC Orders ED Discharge Orders     None         Peter Garter, [place] 06/10/23 1223    Coral Spikes, DO 06/10/23 1527

## 2023-06-10 NOTE — Discharge Instructions (Addendum)
As discussed, visit emergency department today overall reassuring.  Labs appear grossly similar to prior labs performed earlier this month.  Will recommend use of Metamucil at home and you may try Dulcolax or other suppository for treatment of constipation.  Keep upcoming appointment tomorrow with your primary care provider for reassessment.  Please do not hesitate to return if the worrisome signs and symptoms we discussed become apparent.

## 2023-06-10 NOTE — ED Triage Notes (Signed)
Pt has not had a bowel movement in about 3 days, was here about 2 weeks ago for same and received enema.

## 2023-06-11 ENCOUNTER — Encounter: Payer: Self-pay | Admitting: Physician Assistant

## 2023-06-11 ENCOUNTER — Ambulatory Visit (INDEPENDENT_AMBULATORY_CARE_PROVIDER_SITE_OTHER): Payer: Medicare Other | Admitting: Physician Assistant

## 2023-06-11 VITALS — BP 106/74 | HR 86 | Temp 97.2°F | Ht 65.0 in | Wt 103.8 lb

## 2023-06-11 DIAGNOSIS — K5909 Other constipation: Secondary | ICD-10-CM | POA: Diagnosis not present

## 2023-06-11 NOTE — Progress Notes (Signed)
0   Patient ID: Joanne Brock, female    DOB: 08-Aug-1950, 72 y.o.   MRN: [id number]   Assessment & Plan:  Other constipation   Assessment and Plan    Chronic Constipation Recent episodes of severe constipation requiring hospital visits and enemas. Stool consistency varies from hard to loose. Previous colonoscopy showed diverticulosis in 2019. -Start daily Metamucil for increased fiber intake. -Consider daily docusate as a stool softener. -Use Dulcolax suppository as needed for acute relief in case of severe constipation. -Increase hydration, possibly with flavor tablets/powders. -Increase physical activity. -Prunes daily (already doing) -Monitor for changes in bowel habits and abdominal pain. -Consider GI referral -Follow up with Dr. Durene Cal in two months to reassess.    Return in about 2 months (around 08/10/2023) for recheck/follow-up with PCP.    Subjective:    Chief Complaint  Patient presents with   Constipation    Pt in office to discuss recurrent constipation and get advise on how this can be managed going forward with UC/ED visits; Pt husband states after pt received enema all symptoms were alleviated.     HPI Discussed the use of AI scribe software for clinical note transcription with the patient, who gave verbal consent to proceed.  History of Present Illness   The patient, with a history of diverticulosis, presents with recurrent constipation and abdominal pain. The patient's caregiver reports two recent episodes of constipation that required enemas for relief. The first episode resulted in a small amount of hard stool followed by a substantial amount of loose stool over an 18-hour period. The second episode resulted in a significant amount of hard stool with very little subsequent stool.  The caregiver reports that the patient's abdominal pain improved after bowel movements.  The patient's diet includes bran cereal five days a week and a significant amount of  prunes. However, the caregiver notes that the patient's diet is not as varied as she would like and the patient's fluid intake is limited. The patient's physical activity is also limited, and the caregiver reports that the patient has been sleeping more since the first hospital visit, up to 15 hours a day.  The caregiver has been considering various options to manage the patient's constipation, including Dulcolax, stool softeners, Miralax, Metamucil, and prune juice. The caregiver is also considering increasing the patient's hydration and physical activity.       Past Medical History:  Diagnosis Date   Allergy    Alzheimer's dementia (HCC)    Cancer (HCC)    basal cell and squamous cell skin cancer   History of skin cancer    squamous and basal x 8 in 2018. Dr. Sharyn Lull. Had been with Dr. Danella Deis.    Kidney stone    x1   Migraines    Aura- smell, spots. once a month- sumatriptan. topamax 50mg  once a day prophylaxis.    Osteopenia     Past Surgical History:  Procedure Laterality Date   COLONOSCOPY     TONSILLECTOMY Bilateral 1960    Family History  Problem Relation Age of Onset   Hypertension Mother    Stroke Mother        TIAs- declined over years and stroke eventually. died 94   Other Father        decline after ? heat stroke. died around 2   Stroke Sister        34. nonsmoker.    Ulcerative colitis Daughter    Colon cancer Neg Hx  Colon polyps Neg Hx    Esophageal cancer Neg Hx    Stomach cancer Neg Hx    Rectal cancer Neg Hx     Social History   Tobacco Use   Smoking status: Never   Smokeless tobacco: Never  Vaping Use   Vaping status: Never Used  Substance Use Topics   Alcohol use: No    Alcohol/week: 0.0 standard drinks of alcohol   Drug use: No     No Known Allergies  Review of Systems NEGATIVE UNLESS OTHERWISE INDICATED IN HPI      Objective:     BP 106/74 (BP Location: Left Arm, Patient Position: Sitting, Cuff Size: Normal)   Pulse 86    Temp (!) 97.2 F (36.2 C) (Temporal)   Ht 5\' 5"  (1.651 m)   Wt 103 lb 12.8 oz (47.1 kg)   SpO2 90%   BMI 17.27 kg/m   Wt Readings from Last 3 Encounters:  06/11/23 103 lb 12.8 oz (47.1 kg)  05/28/23 110 lb (49.9 kg)  05/23/23 110 lb 3.7 oz (50 kg)    BP Readings from Last 3 Encounters:  06/11/23 106/74  06/10/23 (!) 141/75  05/23/23 116/72     Physical Exam Vitals and nursing note reviewed.  Constitutional:      Appearance: Normal appearance.  Cardiovascular:     Rate and Rhythm: Normal rate and regular rhythm.  Pulmonary:     Effort: Pulmonary effort is normal.     Breath sounds: Normal breath sounds.  Abdominal:     General: Abdomen is flat. Bowel sounds are normal.     Palpations: Abdomen is soft. There is no mass.     Tenderness: There is no abdominal tenderness. There is no right CVA tenderness or left CVA tenderness.  Neurological:     Mental Status: She is alert.  Psychiatric:        Mood and Affect: Mood normal.        Behavior: Behavior normal.         Shavone Nevers M Florinda Taflinger, PA-C

## 2023-06-11 NOTE — Telephone Encounter (Signed)
Please see as FYI for patient upcoming appointment

## 2023-06-15 NOTE — Telephone Encounter (Signed)
Please see patient message and advise.

## 2023-06-21 ENCOUNTER — Other Ambulatory Visit: Payer: Self-pay

## 2023-06-21 ENCOUNTER — Emergency Department (HOSPITAL_BASED_OUTPATIENT_CLINIC_OR_DEPARTMENT_OTHER): Payer: Medicare Other

## 2023-06-21 ENCOUNTER — Emergency Department (HOSPITAL_BASED_OUTPATIENT_CLINIC_OR_DEPARTMENT_OTHER)
Admission: EM | Admit: 2023-06-21 | Discharge: 2023-06-21 | Disposition: A | Discharge status: Home / Self Care | Payer: Medicare Other | Attending: Emergency Medicine | Admitting: Emergency Medicine

## 2023-06-21 ENCOUNTER — Encounter (HOSPITAL_BASED_OUTPATIENT_CLINIC_OR_DEPARTMENT_OTHER): Payer: Self-pay

## 2023-06-21 DIAGNOSIS — R1032 Left lower quadrant pain: Secondary | ICD-10-CM

## 2023-06-21 DIAGNOSIS — G309 Alzheimer's disease, unspecified: Secondary | ICD-10-CM | POA: Diagnosis not present

## 2023-06-21 DIAGNOSIS — K5900 Constipation, unspecified: Secondary | ICD-10-CM | POA: Insufficient documentation

## 2023-06-21 DIAGNOSIS — N3289 Other specified disorders of bladder: Secondary | ICD-10-CM | POA: Diagnosis not present

## 2023-06-21 DIAGNOSIS — N200 Calculus of kidney: Secondary | ICD-10-CM | POA: Diagnosis not present

## 2023-06-21 DIAGNOSIS — R109 Unspecified abdominal pain: Secondary | ICD-10-CM | POA: Diagnosis not present

## 2023-06-21 LAB — CBC WITH DIFFERENTIAL/PLATELET
Abs Immature Granulocytes: 0.01 10*3/uL (ref 0.00–0.07)
Basophils Absolute: 0 10*3/uL (ref 0.0–0.1)
Basophils Relative: 0 %
Eosinophils Absolute: 0 10*3/uL (ref 0.0–0.5)
Eosinophils Relative: 0 %
HCT: 37 % (ref 36.0–46.0)
Hemoglobin: 12.7 g/dL (ref 12.0–15.0)
Immature Granulocytes: 0 %
Lymphocytes Relative: 27 %
Lymphs Abs: 1.5 10*3/uL (ref 0.7–4.0)
MCH: 29.6 pg (ref 26.0–34.0)
MCHC: 34.3 g/dL (ref 30.0–36.0)
MCV: 86.2 fL (ref 80.0–100.0)
Monocytes Absolute: 0.4 10*3/uL (ref 0.1–1.0)
Monocytes Relative: 7 %
Neutro Abs: 3.7 10*3/uL (ref 1.7–7.7)
Neutrophils Relative %: 66 %
Platelets: 207 10*3/uL (ref 150–400)
RBC: 4.29 MIL/uL (ref 3.87–5.11)
RDW: 12.2 % (ref 11.5–15.5)
WBC: 5.6 10*3/uL (ref 4.0–10.5)
nRBC: 0 % (ref 0.0–0.2)

## 2023-06-21 LAB — COMPREHENSIVE METABOLIC PANEL
ALT: 12 U/L (ref 0–44)
AST: 23 U/L (ref 15–41)
Albumin: 4 g/dL (ref 3.5–5.0)
Alkaline Phosphatase: 68 U/L (ref 38–126)
Anion gap: 9 (ref 5–15)
BUN: 12 mg/dL (ref 8–23)
CO2: 26 mmol/L (ref 22–32)
Calcium: 9.4 mg/dL (ref 8.9–10.3)
Chloride: 105 mmol/L (ref 98–111)
Creatinine, Ser: 0.85 mg/dL (ref 0.44–1.00)
GFR, Estimated: >60 mL/min (ref >60)
Glucose, Bld: 95 mg/dL (ref 70–99)
Potassium: 3.9 mmol/L (ref 3.5–5.1)
Sodium: 140 mmol/L (ref 135–145)
Total Bilirubin: 0.6 mg/dL (ref 0.0–1.2)
Total Protein: 7.1 g/dL (ref 6.5–8.1)

## 2023-06-21 LAB — LACTIC ACID, PLASMA: Lactic Acid, Venous: 1.1 mmol/L (ref 0.5–1.9)

## 2023-06-21 MED ORDER — FLEET ENEMA RE ENEM
1.0000 | ENEMA | Freq: Once | RECTAL | Status: AC
  Administered 2023-06-21: 1 via RECTAL
  Filled 2023-06-21: qty 1

## 2023-06-21 NOTE — ED Provider Notes (Signed)
 [place] [facility] Provider Note   CSN: [id number] Arrival date & time: 06/21/23  1405     History  Chief Complaint  Patient presents with   Constipation    Joanne Brock is a 73 y.o. female.  HPI Patient has had problems with recurrent constipation for about a month and a half.  Patient has not had a bowel movement for 3 days.  Her husband has observed some abdominal discomfort which seems to be to the left lower quadrant.  They have been trying a daily stool softener and Metamucil.  He is also tried a Dulcolax suppository and some limited manual disimpaction without relief of symptoms.  No vomiting and no fever.    Home Medications Prior to Admission medications   Medication Sig Start Date End Date Taking? Authorizing Provider  memantine  (NAMENDA ) 10 MG tablet Take 1 tablet twice a day 02/16/23   Georjean Darice HERO, MD  Multiple Vitamin (MULTIVITAMIN) tablet Take 1 tablet by mouth daily.    [provider]  ondansetron  (ZOFRAN -ODT) 4 MG disintegrating tablet DISSOLVE 1 TABLET BY MOUTH EVERY 8 HOURS AS NEEDED FOR NAUSEA AND/OR VOMITING 05/08/22   Katrinka Garnette KIDD, MD  rizatriptan  (MAXALT ) 10 MG tablet TAKE 1 TABLET BY MOUTH AS NEEDED FOR MIGRAINE. MAY REPEAT IN 2 HOURS IF NEEDED Patient not taking: Reported on 06/11/2023 05/23/21   Katrinka Garnette KIDD, MD      Allergies    Patient has no known allergies.    Review of Systems   Review of Systems  Physical Exam Updated Vital Signs BP (!) 151/75   Pulse 87   Temp 97.9 F (36.6 C) (Oral)   Resp 18   SpO2 100%  Physical Exam Constitutional:      Comments: Alert nontoxic clinically well in appearance.  Well-nourished well-developed.  No respiratory distress.  HENT:     Head: Normocephalic and atraumatic.     Mouth/Throat:     Pharynx: Oropharynx is clear.  Eyes:     Extraocular Movements: Extraocular movements intact.  Cardiovascular:     Rate and Rhythm: Normal rate and  regular rhythm.  Pulmonary:     Effort: Pulmonary effort is normal.     Breath sounds: Normal breath sounds.  Abdominal:     Comments: Abdomen soft without guarding.  Patient does express discomfort with palpation to the lower abdomen diffusely and slightly more concentrated to the left.  Genitourinary:    Comments: No stool in the vault.  No visible blood. Musculoskeletal:        General: No swelling or tenderness. Normal range of motion.     Right lower leg: No edema.     Left lower leg: No edema.  Skin:    General: Skin is warm and dry.  Neurological:     General: No focal deficit present.     Comments: Patient is alert and pleasant.  Patient follows commands appropriately.  Patient has known Alzheimer's.  At this time no focal deficits.  Psychiatric:        Mood and Affect: Mood normal.     ED Results / Procedures / Treatments   Labs (all labs ordered are listed, but only abnormal results are displayed) Labs Reviewed  COMPREHENSIVE METABOLIC PANEL  LACTIC ACID, PLASMA  CBC WITH DIFFERENTIAL/PLATELET  LACTIC ACID, PLASMA  URINALYSIS, ROUTINE W REFLEX MICROSCOPIC    EKG None  Radiology CT ABDOMEN PELVIS WO CONTRAST Result Date: 06/21/2023 CLINICAL DATA:  Acute  generalized abdominal pain. EXAM: CT ABDOMEN AND PELVIS WITHOUT CONTRAST TECHNIQUE: Multidetector CT imaging of the abdomen and pelvis was performed following the standard protocol without IV contrast. RADIATION DOSE REDUCTION: This exam was performed according to the departmental dose-optimization program which includes automated exposure control, adjustment of the mA and/or kV according to patient size and/or use of iterative reconstruction technique. COMPARISON:  March 14, 2022. FINDINGS: Lower chest: No acute abnormality. Hepatobiliary: No focal liver abnormality is seen. No gallstones, gallbladder wall thickening, or biliary dilatation. Pancreas: Unremarkable. No pancreatic ductal dilatation or surrounding  inflammatory changes. Spleen: Normal in size without focal abnormality. Adrenals/Urinary Tract: Adrenal glands appear normal. Small nonobstructive left renal calculus. No hydronephrosis or renal obstruction is noted. Mild urinary bladder distention is noted. Stomach/Bowel: Stomach is within normal limits. Appendix appears normal. There is no evidence of bowel obstruction. Moderate rectal wall thickening is noted concerning for inflammation or proctitis. Vascular/Lymphatic: Aortic atherosclerosis. No enlarged abdominal or pelvic lymph nodes. Reproductive: Uterus and bilateral adnexa are unremarkable. Other: No abdominal wall hernia or abnormality. No abdominopelvic ascites. Musculoskeletal: No acute or significant osseous findings. IMPRESSION: Moderate rectal wall thickening is noted concerning for inflammation or proctitis. Small nonobstructive left renal calculus. Aortic Atherosclerosis (ICD10-I70.0). Electronically Signed   By: Lynwood Landy Raddle M.D.   On: 06/21/2023 16:52    Procedures Procedures    Medications Ordered in ED Medications  sodium phosphate (FLEET) enema 1 enema (1 enema Rectal Given 06/21/23 1736)    ED Course/ Medical Decision Making/ A&P                                 Medical Decision Making Amount and/or Complexity of Data Reviewed Labs: ordered. Radiology: ordered.  Risk OTC drugs.   Presents to decrease frequency of stool.  There is also been some observable pain reports for the left lower quadrant.  At this time we will proceed with basic lab work and CT scan.  Normal metabolic panel lactic acid 1.1 normal white count and differential.  CT abdomen reviewed by radiology no obstruction.  Some inflammation of the rectum.  On physical examination there is no stool in the vault.  No impaction present.  CT scan suggest some possible inflammatory change in the rectum consistent with either proctitis or inflammation.  At this time, with no fever, no leukocytosis, no  elevation lactic acid will not opt for empiric antibiotics.  Patient does have close follow-up and I reviewed observation with the patient's husband monitoring temperature, observing for pain and general function.  He voices understanding.  Patient has had decreased frequency to bowel movement and previously significant problems with impaction and constipation.  At this time they will add MiraLAX to the regimen.  Return precautions reviewed.        Final Clinical Impression(s) / ED Diagnoses Final diagnoses:  Constipation, unspecified constipation type  Left lower quadrant abdominal pain    Rx / DC Orders ED Discharge Orders     None         Armenta Canning, MD 06/21/23 2347

## 2023-06-21 NOTE — Discharge Instructions (Addendum)
 1.  You may add MiraLAX every other day to your regimen for constipation. 2.  At this time your CT scan does not show any stool impaction and the physical exam does not show any impaction. 3.  The CT shows some inflammation of the rectal wall.  This may be from local irritation.  Observe for any fever, persistent pain or other concerning symptoms.  At this time antibiotics are not being started.  Follow-up with your doctor for recheck soon as possible.

## 2023-06-21 NOTE — ED Triage Notes (Signed)
 Pt has been having issues with constipation. Husband states they are day three of it. Husband has been trying to disimpact her digitally w/o success. Pt has dementia.

## 2023-06-25 ENCOUNTER — Ambulatory Visit (INDEPENDENT_AMBULATORY_CARE_PROVIDER_SITE_OTHER): Payer: Medicare Other | Admitting: Internal Medicine

## 2023-06-25 ENCOUNTER — Encounter: Payer: Self-pay | Admitting: Internal Medicine

## 2023-06-25 VITALS — BP 128/68 | HR 84 | Temp 97.2°F | Ht 65.0 in | Wt 103.6 lb

## 2023-06-25 DIAGNOSIS — K5909 Other constipation: Secondary | ICD-10-CM

## 2023-06-25 DIAGNOSIS — K6289 Other specified diseases of anus and rectum: Secondary | ICD-10-CM

## 2023-06-25 DIAGNOSIS — I700 Atherosclerosis of aorta: Secondary | ICD-10-CM | POA: Diagnosis not present

## 2023-06-25 MED ORDER — CIPROFLOXACIN HCL 500 MG PO TABS: 500.0000 mg | ORAL_TABLET | Freq: Two times a day (BID) | ORAL | 0 refills | Status: AC

## 2023-06-25 NOTE — Assessment & Plan Note (Signed)
 Advised caregiver to discuss with Primary Care Provider (PCP), notified of results from emergency room.

## 2023-06-25 NOTE — Progress Notes (Signed)
 ==============================  [facility]: [phone]   -- Medical Office Visit --  Patient: Joanne Brock      Age: 73 y.o.       Sex:  female  Date:   06/25/2023 Today's Healthcare Provider: Bernardino KANDICE Cone, MD  ==============================   CHIEF COMPLAINT: ER follow-up Orelia to ED on 06/21/23 for constipation and stomach pain. Constipated or three or four days. Has went two other times for the same reason.)   SUBJECTIVE: 73 y.o. female who has Hyperlipidemia; History of skin cancer; Migraines; Vitamin D  deficiency; History of adenomatous polyp of colon; Osteopenia; Morton neuroma, left; Late onset Alzheimer's disease without behavioral disturbance (HCC); and Aortic atherosclerosis (HCC) on their problem list.  History of Present Illness The patient, under the care of a neurologist for Alzheimer's disease, presented with a recurrent issue of severe constipation. The patient's caregiver reported multiple visits to the emergency room due to inability to pass stool, which was resolved each time with an enema. The first two instances resulted in a significant amount of stool, while the third produced less, possibly due to a shorter duration of constipation. The caregiver also attempted to manually disimpact the patient but was unsuccessful due to the large size and hardness of the stool.  The patient's bowel movements were described as small, similar to the size of a thumb, and sometimes even smaller. This was associated with pain in the lower left flank, particularly when attempting to sit or rise. The patient has no history of hip or back injuries, leading to the belief that the pain is gut-related.  An X-ray of the patient's gut, requested by a previous healthcare provider, revealed a substantial amount of stool. However, a more recent CT scan did not comment on stool but indicated possible proctitis. The patient's caregiver reported that the  patient's bowel movements have been small and infrequent, leading to discomfort and pain.  The patient's current regimen includes Metamucil and Docusate, with a gradual increase in dosage. Despite these interventions, the patient's bowel movements remain irregular and small in size. The patient's caregiver also reported a recent weight loss, although the patient's appetite remains relatively stable. The patient is currently on Namenda  for Alzheimer's disease, which lists constipation as a potential side effect.  In summary, the patient with Alzheimer's disease is experiencing recurrent severe constipation, leading to discomfort and pain. Despite current interventions, the patient's bowel movements remain irregular and small in size. The patient's caregiver is actively involved in managing the patient's condition and is seeking further guidance to alleviate the patient's symptoms.  Past Medical History - Constipation - Proctitis - Alzheimer's disease  RADIOLOGY Abdominal X-ray: Substantial amount of stool (05/23/2023) Abdominal CT: Possible proctitis, arteriosclerosis (06/21/2023)   Current Outpatient Medications on File Prior to Visit  Medication Sig   memantine  (NAMENDA ) 10 MG tablet Take 1 tablet twice a day   Multiple Vitamin (MULTIVITAMIN) tablet Take 1 tablet by mouth daily.   ondansetron  (ZOFRAN -ODT) 4 MG disintegrating tablet DISSOLVE 1 TABLET BY MOUTH EVERY 8 HOURS AS NEEDED FOR NAUSEA AND/OR VOMITING   rizatriptan  (MAXALT ) 10 MG tablet TAKE 1 TABLET BY MOUTH AS NEEDED FOR MIGRAINE. MAY REPEAT IN 2 HOURS IF NEEDED   No current facility-administered medications on file prior to visit.  There are no discontinued medications.    Objective   Physical Exam     06/25/2023    1:08 PM 06/21/2023    6:36 PM 06/21/2023    2:09  PM  Vitals with BMI  Height 5' 5    Weight 103 lbs 10 oz    BMI 17.24    Systolic 128 151 861  Diastolic 68 75 78  Pulse 84 87 90   Wt Readings from Last 10  Encounters:  06/25/23 103 lb 9.6 oz (47 kg)  06/11/23 103 lb 12.8 oz (47.1 kg)  05/28/23 110 lb (49.9 kg)  05/23/23 110 lb 3.7 oz (50 kg)  05/21/23 108 lb 9.6 oz (49.3 kg)  02/16/23 107 lb 6.4 oz (48.7 kg)  07/21/22 99 lb 12.8 oz (45.3 kg)  05/22/22 104 lb (47.2 kg)  03/08/22 104 lb 6 oz (47.3 kg)  02/24/22 104 lb 12.8 oz (47.5 kg)   Vital signs reviewed.  Nursing notes reviewed. Weight trend reviewed. Abnormalities and Problem-Specific physical exam findings:  she says thank you at end but entire appointment remains quiet and her concerned partner gives all history.  She has no apparent distress but reports some pain in left pelvic area with standing.  General Appearance:  No acute distress appreciable.   Well-groomed, healthy-appearing female.  Well proportioned with no abnormal fat distribution.  Good muscle tone. Pulmonary:  Normal work of breathing at rest, no respiratory distress apparent. SpO2: 97 %  Musculoskeletal: All extremities are intact.  Neurological:  Awake, alert, oriented, and engaged.  No obvious focal neurological deficits or cognitive impairments.  Sensorium seems unclouded.   Speech is clear and coherent with logical content. Psychiatric:  Appropriate mood, pleasant and cooperative demeanor, thoughtful and engaged during the exam    No results found for any visits on 06/25/23. Admission on 06/21/2023, Discharged on 06/21/2023  Component Date Value   Sodium 06/21/2023 140    Potassium 06/21/2023 3.9    Chloride 06/21/2023 105    CO2 06/21/2023 26    Glucose, Bld 06/21/2023 95    BUN 06/21/2023 12    Creatinine, Ser 06/21/2023 0.85    Calcium 06/21/2023 9.4    Total Protein 06/21/2023 7.1    Albumin 06/21/2023 4.0    AST 06/21/2023 23    ALT 06/21/2023 12    Alkaline Phosphatase 06/21/2023 68    Total Bilirubin 06/21/2023 0.6    GFR, Estimated 06/21/2023 >60    Anion gap 06/21/2023 9    Lactic Acid, Venous 06/21/2023 1.1    WBC 06/21/2023 5.6    RBC  06/21/2023 4.29    Hemoglobin 06/21/2023 12.7    HCT 06/21/2023 37.0    MCV 06/21/2023 86.2    MCH 06/21/2023 29.6    MCHC 06/21/2023 34.3    RDW 06/21/2023 12.2    Platelets 06/21/2023 207    nRBC 06/21/2023 0.0    Neutrophils Relative % 06/21/2023 66    Neutro Abs 06/21/2023 3.7    Lymphocytes Relative 06/21/2023 27    Lymphs Abs 06/21/2023 1.5    Monocytes Relative 06/21/2023 7    Monocytes Absolute 06/21/2023 0.4    Eosinophils Relative 06/21/2023 0    Eosinophils Absolute 06/21/2023 0.0    Basophils Relative 06/21/2023 0    Basophils Absolute 06/21/2023 0.0    Immature Granulocytes 06/21/2023 0    Abs Immature Granulocytes 06/21/2023 0.01   Admission on 06/10/2023, Discharged on 06/10/2023  Component Date Value   Sodium 06/10/2023 139    Potassium 06/10/2023 3.8    Chloride 06/10/2023 100    CO2 06/10/2023 28    Glucose, Bld 06/10/2023 103 (H)    BUN 06/10/2023 11    Creatinine, Ser  06/10/2023 1.05 (H)    Calcium 06/10/2023 10.1    Total Protein 06/10/2023 7.5    Albumin 06/10/2023 4.5    AST 06/10/2023 26    ALT 06/10/2023 14    Alkaline Phosphatase 06/10/2023 74    Total Bilirubin 06/10/2023 0.9    GFR, Estimated 06/10/2023 57 (L)    Anion gap 06/10/2023 11    WBC 06/10/2023 6.6    RBC 06/10/2023 4.72    Hemoglobin 06/10/2023 13.9    HCT 06/10/2023 40.9    MCV 06/10/2023 86.7    MCH 06/10/2023 29.4    MCHC 06/10/2023 34.0    RDW 06/10/2023 12.1    Platelets 06/10/2023 253    nRBC 06/10/2023 0.0    Lipase 06/10/2023 45   Scanned Document on 05/29/2023  Component Date Value   [facility] Mammogram 01/11/2023 0-4 [facility]   Lab on 05/21/2023  Component Date Value   MICRO NUMBER: 05/21/2023 [id number]    SPECIMEN QUALITY: 05/21/2023 Adequate    Sample Source 05/21/2023 URINE    STATUS: 05/21/2023 FINAL    Result: 05/21/2023 Less than 10,000 CFU/mL of single Gram positive organism isolated. No further testing will be performed. If clinically indicated, recollection  using a method to minimize contamination, with prompt transfer to Urine Culture Transport Tube, is recommended.    Sodium 05/21/2023 143    Potassium 05/21/2023 3.9    Chloride 05/21/2023 104    CO2 05/21/2023 30    Glucose, Bld 05/21/2023 95    BUN 05/21/2023 14    Creatinine, Ser 05/21/2023 0.89    Total Bilirubin 05/21/2023 0.5    Alkaline Phosphatase 05/21/2023 82    AST 05/21/2023 19    ALT 05/21/2023 11    Total Protein 05/21/2023 7.3    Albumin 05/21/2023 4.4    GFR 05/21/2023 65.54    Calcium 05/21/2023 9.9    WBC 05/21/2023 6.5    RBC 05/21/2023 4.39    Hemoglobin 05/21/2023 13.0    HCT 05/21/2023 39.2    MCV 05/21/2023 89.2    MCHC 05/21/2023 33.1    RDW 05/21/2023 13.0    Platelets 05/21/2023 252.0    Neutrophils Relative % 05/21/2023 77.4 (H)    Lymphocytes Relative 05/21/2023 15.1    Monocytes Relative 05/21/2023 7.1    Eosinophils Relative 05/21/2023 0.1    Basophils Relative 05/21/2023 0.3    Neutro Abs 05/21/2023 5.0    Lymphs Abs 05/21/2023 1.0    Monocytes Absolute 05/21/2023 0.5    Eosinophils Absolute 05/21/2023 0.0    Basophils Absolute 05/21/2023 0.0    Color, Urine 05/21/2023 YELLOW    APPearance 05/21/2023 CLEAR    Specific Gravity, Urine 05/21/2023 1.025    pH 05/21/2023 6.0    Total Protein, Urine 05/21/2023 TRACE (A)    Urine Glucose 05/21/2023 NEGATIVE    Ketones, ur 05/21/2023 NEGATIVE    Bilirubin Urine 05/21/2023 NEGATIVE    Hgb urine dipstick 05/21/2023 NEGATIVE    Urobilinogen, UA 05/21/2023 0.2    Leukocytes,Ua 05/21/2023 NEGATIVE    Nitrite 05/21/2023 NEGATIVE    WBC, UA 05/21/2023 0-2/hpf    RBC / HPF 05/21/2023 0-2/hpf    Mucus, UA 05/21/2023 Presence of (A)    Squamous Epithelial / HPF 05/21/2023 Rare(0-4/hpf)   No image results found.  Assessment & Plan Proctitis Proctitis, identified on CT, likely contributes to constipation and pain. A potential bacterial infection from severe constipation is suspected. Antibiotics will be  considered if pain persists, and signs of infection will be monitored.  Chronic constipation Chronic constipation with severe episodes necessitating emergency room visits for enemas is noted. A recent CT scan showed proctitis without abnormal stool amounts, likely due to a weak, inflamed rectum, possibly from bacterial infection. Treatment includes mineral oil enemas as needed, continuing Metamucil and Docusate, adding Miralax daily, and using Senna if no bowel movement occurs in 24 hours. If Senna is ineffective, add Milk of Magnesia, followed by a Fleet's enema if needed. Stool consistency will be monitored to adjust stool softeners. Daily bowel movements are encouraged. Antibiotics may be considered if pain persists.  Dietary adjustments and monitoring for severe symptoms are discussed. Fiber-rich foods and probiotics are encouraged. Hardening of the arteries noted on CT will be discussed with the primary care physician. Severe abdominal pain, mental status changes, or fever will trigger an emergency room visit. Follow-up with Dr. Katrinka is advised, possibly before the March appointment. Aortic atherosclerosis [facility]) Advised caregiver to discuss with Primary Care Provider (PCP), notified of results from emergency room.     Orders Placed During this Encounter:  No orders of the defined types were placed in this encounter.  Meds ordered this encounter  Medications   ciprofloxacin  (CIPRO ) 500 MG tablet    Sig: Take 1 tablet (500 mg total) by mouth 2 (two) times daily for 3 days.    Dispense:  6 tablet    Refill:  0   This document was synthesized by artificial intelligence ([employer]bridge) using HIPAA-compliant recording of the clinical interaction;   We discussed the use of AI scribe software for clinical note transcription with the patient, who gave verbal consent to proceed.

## 2023-06-25 NOTE — Patient Instructions (Addendum)
 VISIT SUMMARY:  During today's visit, we discussed your ongoing issues with severe constipation, which has led to multiple emergency room visits. We also reviewed your recent weight loss and the management of your Alzheimer's disease. A recent CT scan indicated possible proctitis, which may be contributing to your symptoms. We have updated your treatment plan to help alleviate your discomfort and improve your bowel movements.  YOUR PLAN:  -CHRONIC CONSTIPATION: Chronic constipation means having infrequent or difficult bowel movements over a long period. To manage this, you should continue taking Metamucil and Docusate, add Miralax daily, and use Senna if you don't have a bowel movement in 24 hours. If Senna doesn't work within 24 hours, take Milk of Magnesia, and if that doesn't work within 24 hours, use a Fleet's enema. We will monitor your stool consistency to adjust your medications as needed. Daily bowel movements are encouraged, and antibiotics may be considered if pain persists.  We suspect the pain is from the proctitis which is likely from the chronic constipation.    -PROCTITIS: Proctitis is inflammation of the lining of the rectum, which can cause pain and contribute to constipation. This may be due to a bacterial infection from severe constipation. We will monitor for signs of infection and consider antibiotics if the pain continues. Antibiotics be prescribed as ciprofloxacin  to your pharmacy.  -WEIGHT LOSS: You have experienced some recent weight loss. To help maintain your weight, try to eat high-calorie foods that you enjoy. We will keep an eye on your weight regularly.  -ALZHEIMER'S DISEASE: Alzheimer's disease is a condition that affects memory and thinking. You are currently taking Namenda  for this, which may cause constipation. We will discuss any necessary medication adjustments with your neurologist, but for now, continue taking Namenda  as prescribed.  -GENERAL HEALTH MAINTENANCE:  We discussed making dietary adjustments and monitoring for severe symptoms. Eating fiber-rich foods and taking probiotics can help. If you experience severe abdominal pain, changes in mental status, or fever, please go to the emergency room. We also noted hardening of the arteries on your CT scan, which we will discuss with your primary care physician.  INSTRUCTIONS:  Please follow up with Dr. Katrinka, possibly before your scheduled appointment in March. If you experience severe abdominal pain, changes in mental status, or fever, go to the emergency room immediately.

## 2023-07-09 ENCOUNTER — Encounter: Payer: Self-pay | Admitting: Family Medicine

## 2023-07-09 ENCOUNTER — Ambulatory Visit (INDEPENDENT_AMBULATORY_CARE_PROVIDER_SITE_OTHER): Payer: Medicare Other | Admitting: Family Medicine

## 2023-07-09 VITALS — BP 110/70 | HR 83 | Temp 97.7°F | Ht 65.0 in | Wt 104.8 lb

## 2023-07-09 DIAGNOSIS — I700 Atherosclerosis of aorta: Secondary | ICD-10-CM | POA: Diagnosis not present

## 2023-07-09 DIAGNOSIS — F0280 Dementia in other diseases classified elsewhere without behavioral disturbance: Secondary | ICD-10-CM | POA: Diagnosis not present

## 2023-07-09 DIAGNOSIS — G301 Alzheimer's disease with late onset: Secondary | ICD-10-CM

## 2023-07-09 DIAGNOSIS — K5909 Other constipation: Secondary | ICD-10-CM

## 2023-07-09 DIAGNOSIS — E785 Hyperlipidemia, unspecified: Secondary | ICD-10-CM

## 2023-07-09 NOTE — Progress Notes (Signed)
Phone [phone] In person visit   Subjective:   Joanne Brock is a 73 y.o. year old very pleasant female patient who presents for/with See problem oriented charting Chief Complaint  Patient presents with   2 month f/u    Pt still battling with constipation.    Past Medical History-  Patient Active Problem List   Diagnosis Date Noted   Late onset Alzheimer's disease without behavioral disturbance (HCC) 03/04/2020    Priority: High   Aortic atherosclerosis (HCC) 06/25/2023    Priority: Medium    Osteopenia 04/16/2017    Priority: Medium    History of adenomatous polyp of colon 02/20/2017    Priority: Medium    Hyperlipidemia 02/08/2017    Priority: Medium    Migraines     Priority: Medium    Morton neuroma, left 10/02/2017    Priority: Low   Vitamin D deficiency 02/20/2017    Priority: Low   History of skin cancer     Priority: Low    Medications- reviewed and updated Current Outpatient Medications  Medication Sig Dispense Refill   memantine (NAMENDA) 10 MG tablet Take 1 tablet twice a day 180 tablet 3   Multiple Vitamin (MULTIVITAMIN) tablet Take 1 tablet by mouth daily.     rizatriptan (MAXALT) 10 MG tablet TAKE 1 TABLET BY MOUTH AS NEEDED FOR MIGRAINE. MAY REPEAT IN 2 HOURS IF NEEDED 10 tablet 11   ondansetron (ZOFRAN-ODT) 4 MG disintegrating tablet DISSOLVE 1 TABLET BY MOUTH EVERY 8 HOURS AS NEEDED FOR NAUSEA AND/OR VOMITING (Patient not taking: Reported on 07/09/2023) 30 tablet 1   No current facility-administered medications for this visit.     Objective:  BP 110/70   Pulse 83   Temp 97.7 F (36.5 C)   Ht 5\' 5"  (1.651 m)   Wt 104 lb 12.8 oz (47.5 kg)   SpO2 97%   BMI 17.44 kg/m  Gen: NAD, resting comfortably CV: RRR no murmurs rubs or gallops Lungs: CTAB no crackles, wheeze, rhonchi Abdomen: soft/nontender including specifically in left lower quadrant where she previously reported pain/nondistended/normal bowel sounds. No rebound or guarding.   Ext: no edema Skin: warm, dry Neuro: Looks to husband for most answers    Assessment and Plan   # Constipation S: Unfortunately patient has had multiple visits to the emergency department since early December for constipation and has required disimpaction at times.  Most recently was seen June 21, 2023 and CT scan showed moderate rectal wall thickening concerning for inflammation versus proctitis-was seen by Dr. Jon Billings in follow-up on 06/25/2023 and placed on ciprofloxacin-thought potentially related to patient discomfort-with etiology as potential bacterial infection from severe constipation.  Multiple potential treatments were given with plan at minimum to continue Metamucil and Colace and encourage fiber rich foods and probiotics.  They did take the ciprofloxacin and husbands sense is that was helpful. They continued miralax 1 capful a day, docusate once a day, metamucil twice daily- senna if seemed to be getting constipated- had a bowel movement about 15 hours later with this. Some loss of control with the bowel movements- never had to use milk of magnesia or fleets enema.  -felt like senna was too slow about 15 hours- discussed that's ok - ended up backing up on docusate -in last week no complaints about lower abdominal pain which is big progress  - wants to try to treat underlying cause.  -walks half mile a day -thinks could work on hydration -avoiding straining We reviewed possible medication  related causes -memantine 3-5% -ondansetron 9-11%- maybe once a month A/P: Patient with chronic constipation issues with multiple emergency department visits.  Most recently with proctitis on CT and seems to have improved substantially with ciprofloxacin treatment by Dr. Jon Billings - We discussed maintaining bowel movement regularity and ultimately opted for 1 full capful in morning and 1/2 capful in evening- if loose stools can hold for a day or two then go back to 1 capful in morning. On the  other end if needing the senna or  milk of magnesia or beyond could go up to 1 capful twice a day.  -Also will continue Metamucil - They prefer to hold off on Colace -Discussed even if has bowel movement with senna 15 hours later I would still consider that the success -Ondansetron you so infrequent and I do not think this is the primary cause - Also encouraged increasing mobility if able as well as hydration-feels she is at least walking half mile per day  #hyperlipidemia #aortic atherosclerosis  S: Medication: None Lab Results  Component Value Date   CHOL 242 (H) 12/20/2021   HDL 59.80 12/20/2021   LDLCALC 158 (H) 12/20/2021   TRIG 125.0 12/20/2021   CHOLHDL 4 12/20/2021   A/P: We reviewed findings from emergency department of aortic atherosclerosis.  Discussed this is a risk factor for heart disease.  With overall health and Alzheimer's dementia ultimately we opted not to add statin medication.  Lipids slightly above goal-maintain without medicine  # Alzheimer's dementia-noted with ongoing gradual decline.  Discussed possibly stopping memantine for possible role in constipation-in the end we opted out of this and to discuss further with Dr. Karel Jarvis at upcoming visit  Recommended follow up: Return for as needed for new, worsening, persistent symptoms. Future Appointments  Date Time Provider Department Center  09/05/2023  8:30 AM Van Clines, MD [id number] None    Lab/Order associations:   ICD-10-CM   1. Chronic constipation  K59.09     2. Late onset Alzheimer's disease without behavioral disturbance (HCC)  G30.1    F02.80     3. Hyperlipidemia, unspecified hyperlipidemia type  E78.5     4. Aortic atherosclerosis (HCC)  I70.0       No orders of the defined types were placed in this encounter.   Return precautions advised.  Tana Conch, MD

## 2023-07-09 NOTE — Patient Instructions (Addendum)
1 full capful in morning and 1/2 capful in evening- if loose stools can hold for a day or two then go back to 1 capful in morning. On the other end if needing the senna or  milk of magnesia or beyond could go up to 1 capful twice a day.   Recommended follow up: Return for as needed for new, worsening, persistent symptoms.

## 2023-08-13 ENCOUNTER — Ambulatory Visit: Payer: Medicare Other | Admitting: Family Medicine

## 2023-09-05 ENCOUNTER — Ambulatory Visit (INDEPENDENT_AMBULATORY_CARE_PROVIDER_SITE_OTHER): Payer: Self-pay | Admitting: Neurology

## 2023-09-05 ENCOUNTER — Encounter: Payer: Self-pay | Admitting: Neurology

## 2023-09-05 VITALS — BP 148/62 | HR 76 | Ht 65.0 in | Wt 110.2 lb

## 2023-09-05 DIAGNOSIS — F0280 Dementia in other diseases classified elsewhere without behavioral disturbance: Secondary | ICD-10-CM

## 2023-09-05 DIAGNOSIS — G301 Alzheimer's disease with late onset: Secondary | ICD-10-CM | POA: Diagnosis not present

## 2023-09-05 MED ORDER — MEMANTINE HCL 10 MG PO TABS: ORAL_TABLET | ORAL | 3 refills | Status: DC

## 2023-09-05 NOTE — Progress Notes (Signed)
 NEUROLOGY FOLLOW UP OFFICE NOTE  [name] [id number] 1951/03/28  HISTORY OF PRESENT ILLNESS: I had the pleasure of seeing Joanne Brock in follow-up in the neurology clinic on 09/05/2023.  The patient was last seen 6 months ago for Alzheimer's dementia without behavioral disturbance. She is again accompanied by her husband who provides the history. She has minimal spontaneous verbal output, looking to her husband when asked questions. She denies any headaches, dizziness, focal numbness/tingling/weakness, no falls. Her husband has noticed she has a slight tremor in both hands, usually more noticeable when there is an increase in tension. She is sleeping well. He has noticed a little more change in cognition and ability to function. She needs assistance with dressing and bathing, giving step by step instructions. She has a little mor difficulty manipulating certain dining utensils but this is easy to handle. A recent change is how much time he can leave her in the apartment, in the past he can be gone for 2 hours, now it can only be for 30 minutes. He would usually call her 20 minutes after leaving, but she is having more difficulty using a cellphone. They will try switching to a landline. He would bring her to the gym where she goes on the stationary bike. They walk half a mile daily. She has more difficulty reading. She had OT work with her for 5 sessions but had difficulty with visuospatial tasks. Mood is good, her ability to read social cues is good. No paranoia or hallucinations. She is on Memantine 10mg  BID, sometimes it is not swallowed the first time, no choking. She has not needed Maxalt for migraine rescue recently.   History on Initial Assessment 01/27/2020: This is a pleasant 73 year old right-handed woman with a history of skin cancer, migraines, nephrolithiasis, presenting for evaluation of memory loss and worsening migraines. She feels like memory issues do not affect her on a regular  basis, and started noticing changes a few months ago. Her husband started noticing changes towards the last quarter of 2019. She is a retired IT trainer and would be able to do simple math in her head, but started using the calculator more. She started noticing that calculations she could do were not coming easily, now taking her a longer time. She used to do the books in 45 minutes, and started taking 6 hours to complete. Her husband took over 3-4 months ago. She was not missing any payments. She denies getting lost driving, leaving the stove on, misplacing things. She is not on any regular medications, she takes prn Maxalt for migraine rescue. Her husband feels changes have been overall stable since 2019 but noticed she is working around them more. Sometimes she asks the same question, especially with time, repeatedly asking when it is time for them to leave. She has occasional word-finding issues. Her mother had memory loss consistent with dementia. Her father had a dementing illness and drug-induced Parkinson's disease but did not have memory issues. No history of significant head injuries or alcohol use.  She started having migraines in her 80s, with pressure over the right temporal region majority of the time. Occasionally she has left temporal pain. No associated nausea/vomiting, photo/phonophobia, visual obscurations. She takes prn Maxalt with good effect. She was having migraines more frequently last May, but this has quieted down, she does not need to take Maxalt every month. She denies any dizziness, diplopia, dysarthria/dysphagia, neck/back pain, focal numbness/tingling/weakness, bowel/bladder dysfunction, anosmia, or tremors. Mood is good. No paranoia  or hallucinations, no personality changes. Sleep is good. She retired in January 2018.  I personally reviewed MRI brain without contrast done 11/2019 which did not show any acute changes. There was mild diffuse atrophy and a few punctate foci of T2  hyperintensity, within normal limits for age.   Neuropsychological evaluation in 02/2020 indicated Alzheimer's dementia without behavioral disturbance, MCI level of function.   Laboratory Data: Lab Results  Component Value Date   TSH 2.04 10/21/2019   Lab Results  Component Value Date   VITAMINB12 328 10/21/2019    PAST MEDICAL HISTORY: Past Medical History:  Diagnosis Date   Allergy    Alzheimer's dementia (HCC)    Cancer (HCC)    basal cell and squamous cell skin cancer   History of skin cancer    squamous and basal x 8 in 2018. Dr. Sharyn Lull. Had been with Dr. Danella Deis.    Kidney stone    x1   Migraines    Aura- smell, spots. once a month- sumatriptan. topamax 50mg  once a day prophylaxis.    Osteopenia     MEDICATIONS: Current Outpatient Medications on File Prior to Visit  Medication Sig Dispense Refill   memantine (NAMENDA) 10 MG tablet Take 1 tablet twice a day 180 tablet 3   Multiple Vitamin (MULTIVITAMIN) tablet Take 1 tablet by mouth daily.     ondansetron (ZOFRAN-ODT) 4 MG disintegrating tablet DISSOLVE 1 TABLET BY MOUTH EVERY 8 HOURS AS NEEDED FOR NAUSEA AND/OR VOMITING 30 tablet 1   rizatriptan (MAXALT) 10 MG tablet TAKE 1 TABLET BY MOUTH AS NEEDED FOR MIGRAINE. MAY REPEAT IN 2 HOURS IF NEEDED 10 tablet 11   No current facility-administered medications on file prior to visit.    ALLERGIES: No Known Allergies  FAMILY HISTORY: Family History  Problem Relation Age of Onset   Hypertension Mother    Stroke Mother        TIAs- declined over years and stroke eventually. died 79   Other Father        decline after ? heat stroke. died around 78   Stroke Sister        57. nonsmoker.    Ulcerative colitis Daughter    Colon cancer Neg Hx    Colon polyps Neg Hx    Esophageal cancer Neg Hx    Stomach cancer Neg Hx    Rectal cancer Neg Hx     SOCIAL HISTORY: Social History   Socioeconomic History   Marital status: Married    Spouse name: Not on file    Number of children: Not on file   Years of education: Not on file   Highest education level: Not on file  Occupational History   Occupation: retired  Tobacco Use   Smoking status: Never   Smokeless tobacco: Never  Vaping Use   Vaping status: Never Used  Substance and Sexual Activity   Alcohol use: No    Alcohol/week: 0.0 standard drinks of alcohol   Drug use: No   Sexual activity: Yes  Other Topics Concern   Not on file  Social History Narrative   Married over 40 years in 2018. 1 daughter 41 years old in 2023. 1 granddaughter born June 2023.    Had to spend great deal of time caring for aging parents- now passed.       Undergrad at [employer].    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 [employer]      Hobbies: travel   Right Handed  One Story Home   Does drink caffeine occasionally   Social Drivers of Corporate investment banker Strain: Low Risk  (05/28/2023)   Overall Financial Resource Strain (CARDIA)    Difficulty of Paying Living Expenses: Not hard at all  Food Insecurity: No Food Insecurity (05/28/2023)   Hunger Vital Sign    Worried About Running Out of Food in the Last Year: Never true    Ran Out of Food in the Last Year: Never true  Transportation Needs: No Transportation Needs (05/28/2023)   PRAPARE - Administrator, Civil Service (Medical): No    Lack of Transportation (Non-Medical): No  Physical Activity: Sufficiently Active (05/28/2023)   Exercise Vital Sign    Days of Exercise per Week: 5 days    Minutes of Exercise per Session: 30 min  Stress: No Stress Concern Present (05/28/2023)   [employer] of Occupational Health - Occupational Stress Questionnaire    Feeling of Stress : Not at all  Social Connections: Moderately Integrated (05/28/2023)   Social Connection and Isolation Panel [NHANES]    Frequency of Communication with Friends and Family: More than three times a week    Frequency of Social Gatherings with Friends and Family:  More than three times a week    Attends Religious Services: More than 4 times per year    Active Member of [employer] or Organizations: No    Attends Banker Meetings: Never    Marital Status: Married  Catering manager Violence: Not At Risk (05/28/2023)   Humiliation, Afraid, Rape, and Kick questionnaire    Fear of Current or Ex-Partner: No    Emotionally Abused: No    Physically Abused: No    Sexually Abused: No     PHYSICAL EXAM: Vitals:   09/05/23 0818  BP: (!) 148/62  Pulse: 76  SpO2: 98%   General: No acute distress Head:  Normocephalic/atraumatic Skin/Extremities: No rash, no edema Neurological Exam: alert and awake. No aphasia or dysarthria but has limited spontaneous conversation, answering with one-word responses. Fund of knowledge is reduced. Attention and concentration are normal.   Cranial nerves: Pupils equal, round. Extraocular movements intact with no nystagmus. Visual fields full but has difficulty counting fingers and unable to recognize faces when showed pictures. No facial asymmetry.  Motor: Bulk and tone normal, muscle strength 5/5 throughout with no pronator drift.   Finger to nose testing intact.  Gait narrow-based and steady, no ataxia. No tremors in office today.   IMPRESSION: his is a pleasant 73 yo RH woman with a history of skin cancer, migraines, nephrolithiasis, migraines, with Alzheimer's dementia without behavioral disturbance. There continues to be slow progression, she is noted to have more visuospatial difficulties today. Continue close supervision, a new day program will be available at [facility] and they are interested in once a week activities. We discussed the possible anxiety about being left alone for longer periods of time and the option of an SSRI, her husband feels this is not needed yet and will let us know if symptoms change. Follow-up in 6 months, call for any changes.   Thank you for allowing me to participate in her care.  Please  do not hesitate to call for any questions or concerns.    Patrcia Dolly, M.D.   CC: Dr. Durene Cal

## 2023-09-05 NOTE — Patient Instructions (Addendum)
 Always good to see you.  Continue Memantine 10mg  twice a day  2. Follow-up in 6 months, let me know if there is anything else I can help with  FALL PRECAUTIONS: Be cautious when walking. Scan the area for obstacles that may increase the risk of trips and falls. When getting up in the mornings, sit up at the edge of the bed for a few minutes before getting out of bed. Consider elevating the bed at the head end to avoid drop of blood pressure when getting up. Walk always in a well-lit room (use night lights in the walls). Avoid area rugs or power cords from appliances in the middle of the walkways. Use a walker or a cane if necessary and consider physical therapy for balance exercise. Get your eyesight checked regularly.  HOME SAFETY: Consider the safety of the kitchen when operating appliances like stoves, microwave oven, and blender. Consider having supervision and share cooking responsibilities until no longer able to participate in those. Accidents with firearms and other hazards in the house should be identified and addressed as well.  ABILITY TO BE LEFT ALONE: If patient is unable to contact 911 operator, consider using LifeLine, or when the need is there, arrange for someone to stay with patients. Smoking is a fire hazard, consider supervision or cessation. Risk of wandering should be assessed by caregiver and if detected at any point, supervision and safe proof recommendations should be instituted.  RECOMMENDATIONS FOR ALL PATIENTS WITH MEMORY PROBLEMS: 1. Continue to exercise (Recommend 30 minutes of walking everyday, or 3 hours every week) 2. Increase social interactions - continue going to [place] and enjoy social gatherings with friends and family 3. Eat healthy, avoid fried foods and eat more fruits and vegetables 4. Maintain adequate blood pressure, blood sugar, and blood cholesterol level. Reducing the risk of stroke and cardiovascular disease also helps promoting better memory. 5. Avoid  stressful situations. Live a simple life and avoid aggravations. Organize your time and prepare for the next day in anticipation. 6. Sleep well, avoid any interruptions of sleep and avoid any distractions in the bedroom that may interfere with adequate sleep quality 7. Avoid sugar, avoid sweets as there is a strong link between excessive sugar intake, diabetes, and cognitive impairment The Mediterranean diet has been shown to help patients reduce the risk of progressive memory disorders and reduces cardiovascular risk. This includes eating fish, eat fruits and green leafy vegetables, nuts like almonds and hazelnuts, walnuts, and also use olive oil. Avoid fast foods and fried foods as much as possible. Avoid sweets and sugar as sugar use has been linked to worsening of memory function.  There is always a concern of gradual progression of memory problems. If this is the case, then we may need to adjust level of care according to patient needs. Support, both to the patient and caregiver, should then be put into place.

## 2023-09-12 DIAGNOSIS — Z23 Encounter for immunization: Secondary | ICD-10-CM | POA: Diagnosis not present

## 2023-11-13 DIAGNOSIS — Z86018 Personal history of other benign neoplasm: Secondary | ICD-10-CM | POA: Diagnosis not present

## 2023-11-13 DIAGNOSIS — L821 Other seborrheic keratosis: Secondary | ICD-10-CM | POA: Diagnosis not present

## 2023-11-13 DIAGNOSIS — Z808 Family history of malignant neoplasm of other organs or systems: Secondary | ICD-10-CM | POA: Diagnosis not present

## 2023-11-13 DIAGNOSIS — Z85828 Personal history of other malignant neoplasm of skin: Secondary | ICD-10-CM | POA: Diagnosis not present

## 2023-11-13 DIAGNOSIS — L578 Other skin changes due to chronic exposure to nonionizing radiation: Secondary | ICD-10-CM | POA: Diagnosis not present

## 2023-11-13 DIAGNOSIS — L719 Rosacea, unspecified: Secondary | ICD-10-CM | POA: Diagnosis not present

## 2023-11-13 DIAGNOSIS — D225 Melanocytic nevi of trunk: Secondary | ICD-10-CM | POA: Diagnosis not present

## 2023-11-13 DIAGNOSIS — L814 Other melanin hyperpigmentation: Secondary | ICD-10-CM | POA: Diagnosis not present

## 2023-12-17 ENCOUNTER — Ambulatory Visit (INDEPENDENT_AMBULATORY_CARE_PROVIDER_SITE_OTHER): Admitting: Family

## 2023-12-17 VITALS — BP 102/62 | HR 98 | Temp 98.2°F | Ht 65.0 in | Wt 102.6 lb

## 2023-12-17 DIAGNOSIS — K5909 Other constipation: Secondary | ICD-10-CM

## 2023-12-17 NOTE — Progress Notes (Signed)
 Patient ID: Joanne Brock, female    DOB: 21-Jun-1950, 73 y.o.   MRN: [id number]  Chief Complaint  Patient presents with   Constipation    Secon bout with constipation first time lasted 6 weeks. About a week ago it started happening again, started complain about pain on left hip.  Started back with medomusil, and senokot, senokot caused a little bowel movement. Trying to figure out what is causing this. States Dr. Jesus told them he thinks it could be proctitas.   Discussed the use of AI scribe software for clinical note transcription with the patient, who gave verbal consent to proceed.  History of Present Illness Joanne Brock is a 73 year old female with Alzheimer's who presents with constipation management along with her husband who is providing medical history.  Constipation - Ongoing constipation managed with Miralax and docusate, with Senna used as needed - Recent need for Senna, which was slow to act and resulted in discomfort and straining - Significant decrease in stool production requiring physical disimpaction - Episodes of straining and unsatisfactory bowel movements - Manual extraction attempts provided minimal relief until a small bowel movement occurred the following morning - Supplies available include Milk of Magnesia and suppositories - No rectal itching, pain, or hemorrhoids  Urinary incontinence - Decline in continence with occasional urinary incontinence  Perianal skin changes - Red stripe along the buttocks, likely due to moisture from diaper use  Appetite and weight changes - Low appetite - Slight weight decrease since January  Assessment & Plan Chronic Constipation Chronic constipation exacerbated by decreased fecal output and dry stool. Previous management with Miralax, docusate, and Senna. Last colonoscopy 2019 normal. Possibly decreased hydration and age-related bowel slowing contributing. Discussed hydration, dietary changes, and alternative  laxatives. - Increase Miralax to one cap in the morning and one cap in the evening. Adjust to half a cap in the evening if stools become too loose. - Very important to get in up to 2 liters of non-caffeinated fluids daily for Miralax to work properly. - Discontinue docusate to avoid excessive looseness. - Introduce magnesium oxide, preferably chelated, once daily at night. Increase to twice daily if needed. - Ok to Use glycerin suppository or enema if immediate relief for hard stool or straining noted. - Consider prescription laxatives like Linzess if over-the-counter measures fail.  Incontinence Early urinary incontinence with occasional episodes. Diaper use causing perianal skin irritation. Discussed use of barrier cream and zinc oxide for skin protection and treatment. - Apply thin layer of zinc oxide (Desitin cream) to treat existing skin irritation. - Use a barrier cream daily to prevent further skin irritation. - Advise consulting with [facility] medical supply in [place] for guidance on which barrier cream to use.  General Health Maintenance Emphasized weight monitoring to maintain above 100 pounds due to appetite and weight loss concerns. - Monitor weight to ensure it remains above 100 pounds.   Subjective:    Outpatient Medications Prior to Visit  Medication Sig Dispense Refill   memantine  (NAMENDA ) 10 MG tablet Take 1 tablet twice a day 180 tablet 3   Multiple Vitamin (MULTIVITAMIN) tablet Take 1 tablet by mouth daily.     ondansetron  (ZOFRAN -ODT) 4 MG disintegrating tablet DISSOLVE 1 TABLET BY MOUTH EVERY 8 HOURS AS NEEDED FOR NAUSEA AND/OR VOMITING 30 tablet 1   rizatriptan  (MAXALT ) 10 MG tablet TAKE 1 TABLET BY MOUTH AS NEEDED FOR MIGRAINE. MAY REPEAT IN 2 HOURS IF NEEDED 10 tablet 11   No facility-administered  medications prior to visit.   Past Medical History:  Diagnosis Date   Allergy    Alzheimer's dementia (HCC)    Cancer (HCC)    basal cell and squamous cell skin cancer    History of skin cancer    squamous and basal x 8 in 2018. Dr. Tricia. Had been with Dr. Helga.    Kidney stone    x1   Migraines    Aura- smell, spots. once a month- sumatriptan. topamax  50mg  once a day prophylaxis.    Osteopenia    Past Surgical History:  Procedure Laterality Date   COLONOSCOPY     TONSILLECTOMY Bilateral 1960   No Known Allergies    Objective:    Physical Exam Vitals and nursing note reviewed.  Constitutional:      Appearance: Normal appearance.  Cardiovascular:     Rate and Rhythm: Normal rate and regular rhythm.  Pulmonary:     Effort: Pulmonary effort is normal.     Breath sounds: Normal breath sounds.  Musculoskeletal:        General: Normal range of motion.  Skin:    General: Skin is warm and dry.  Neurological:     Mental Status: She is alert.  Psychiatric:        Mood and Affect: Mood and affect normal.        Behavior: Behavior normal.        Cognition and Memory: Cognition is impaired (Alzheimer's). Memory is impaired.    BP 102/62   Pulse 98   Temp 98.2 F (36.8 C) (Temporal)   Ht 5' 5 (1.651 m)   Wt 102 lb 9.6 oz (46.5 kg)   SpO2 95%   BMI 17.07 kg/m  Wt Readings from Last 3 Encounters:  12/17/23 102 lb 9.6 oz (46.5 kg)  09/05/23 110 lb 3.2 oz (50 kg)  07/09/23 104 lb 12.8 oz (47.5 kg)      Lucius Krabbe, NP

## 2023-12-17 NOTE — Patient Instructions (Addendum)
 It was very nice to see you today!   Encourage 2 liters of non-caffeinated beverages all through the day. This is necessary for the Miralax to work. Increase the Miralax to 1 full scoop in am and 1 full in pm. If stools become too loose, reduce the dose to 1/2 scoop in the pm. Can add Magnesium oxide, chelated, 1 pill a day, or twice a day to help ensure a soft BM daily. I would avoid Senna laxatives for now.  Ok to use glycerin suppository or an enema if you suspect there is stool possibly stuck.  Call back if still having concerns.      PLEASE NOTE:  If you had any lab tests please let us  know if you have not heard back within a few days. You may see your results on MyChart before we have a chance to review them but we will give you a call once they are reviewed by us . If we ordered any referrals today, please let us  know if you have not heard from their office within the next week.

## 2023-12-27 DIAGNOSIS — Z01419 Encounter for gynecological examination (general) (routine) without abnormal findings: Secondary | ICD-10-CM | POA: Diagnosis not present

## 2024-01-14 ENCOUNTER — Encounter: Payer: Self-pay | Admitting: Neurology

## 2024-01-14 DIAGNOSIS — Z1231 Encounter for screening mammogram for malignant neoplasm of breast: Secondary | ICD-10-CM | POA: Diagnosis not present

## 2024-01-15 NOTE — Telephone Encounter (Signed)
 Good morning. We resent the request from [employer] to medical records.

## 2024-03-18 ENCOUNTER — Other Ambulatory Visit: Payer: Self-pay | Admitting: Neurology

## 2024-03-28 DIAGNOSIS — Z23 Encounter for immunization: Secondary | ICD-10-CM | POA: Diagnosis not present

## 2024-03-31 ENCOUNTER — Encounter: Payer: Self-pay | Admitting: Family Medicine

## 2024-04-01 ENCOUNTER — Ambulatory Visit (INDEPENDENT_AMBULATORY_CARE_PROVIDER_SITE_OTHER): Admitting: Neurology

## 2024-04-01 ENCOUNTER — Encounter: Payer: Self-pay | Admitting: Neurology

## 2024-04-01 VITALS — BP 137/80 | HR 81 | Ht 65.0 in | Wt 107.0 lb

## 2024-04-01 DIAGNOSIS — F0280 Dementia in other diseases classified elsewhere without behavioral disturbance: Secondary | ICD-10-CM

## 2024-04-01 DIAGNOSIS — G301 Alzheimer's disease with late onset: Secondary | ICD-10-CM | POA: Diagnosis not present

## 2024-04-01 NOTE — Patient Instructions (Signed)
 It's always a pleasure to see you. Continue Memantine  10mg  twice a day. Agree with looking into joining more activities such as the group singing. Follow-up in 6 months, call for any changes.   FALL PRECAUTIONS: Be cautious when walking. Scan the area for obstacles that may increase the risk of trips and falls. When getting up in the mornings, sit up at the edge of the bed for a few minutes before getting out of bed. Consider elevating the bed at the head end to avoid drop of blood pressure when getting up. Walk always in a well-lit room (use night lights in the walls). Avoid area rugs or power cords from appliances in the middle of the walkways. Use a walker or a cane if necessary and consider physical therapy for balance exercise. Get your eyesight checked regularly.  HOME SAFETY: Consider the safety of the kitchen when operating appliances like stoves, microwave oven, and blender. Consider having supervision and share cooking responsibilities until no longer able to participate in those. Accidents with firearms and other hazards in the house should be identified and addressed as well.  ABILITY TO BE LEFT ALONE: If patient is unable to contact 911 operator, consider using LifeLine, or when the need is there, arrange for someone to stay with patients. Smoking is a fire hazard, consider supervision or cessation. Risk of wandering should be assessed by caregiver and if detected at any point, supervision and safe proof recommendations should be instituted.   RECOMMENDATIONS FOR ALL PATIENTS WITH MEMORY PROBLEMS: 1. Continue to exercise (Recommend 30 minutes of walking everyday, or 3 hours every week) 2. Increase social interactions - continue going to [place] and enjoy social gatherings with friends and family 3. Eat healthy, avoid fried foods and eat more fruits and vegetables 4. Maintain adequate blood pressure, blood sugar, and blood cholesterol level. Reducing the risk of stroke and cardiovascular  disease also helps promoting better memory. 5. Avoid stressful situations. Live a simple life and avoid aggravations. Organize your time and prepare for the next day in anticipation. 6. Sleep well, avoid any interruptions of sleep and avoid any distractions in the bedroom that may interfere with adequate sleep quality 7. Avoid sugar, avoid sweets as there is a strong link between excessive sugar intake, diabetes, and cognitive impairment The Mediterranean diet has been shown to help patients reduce the risk of progressive memory disorders and reduces cardiovascular risk. This includes eating fish, eat fruits and green leafy vegetables, nuts like almonds and hazelnuts, walnuts, and also use olive oil. Avoid fast foods and fried foods as much as possible. Avoid sweets and sugar as sugar use has been linked to worsening of memory function.  There is always a concern of gradual progression of memory problems. If this is the case, then we may need to adjust level of care according to patient needs. Support, both to the patient and caregiver, should then be put into place.

## 2024-04-01 NOTE — Progress Notes (Signed)
 NEUROLOGY FOLLOW UP OFFICE NOTE  Joanne Brock [id number] 1950/10/05  HISTORY OF PRESENT ILLNESS: I had the pleasure of seeing Joanne Brock in follow-up in the neurology clinic on 04/01/2024.  The patient was last seen 6 months ago for Alzheimer's dementia without behavioral disturbance. She is again accompanied by her husband who provides the history. Since her last visit, there has been continued cognitive decline. She has good and bad days, sometimes she has a 70% day, but she is 40% almost always in the evenings with inability to perform certain rudimentary tasks such as drying her hands on a towel or brushing her teeth. She is between needing some supervision and total supervision with dressing and bathing. She does pretty well with finger foods, with more difficulty using utensils to eat. Physically she is doing fine, she eats well and sleeps really well. She is on Memantine  10mg  BID without side effects. She has not had a migraine in months and has not needed prn Rizatriptan . Her constipation has largely resolved. She intermittently complains of pain in the left hip/flank, Aleve helps within a few hours. Pain does not affect ambulation.   Her husband plans for them to stay in Independent Living for the foreseeable future. He is with her 24/7 except to get her breakfast in the dining hall for 15 minutes. There were 2 instances she left the apartment to look for him while he was in the apartment, one time while he was in the shower, another time he heard the door close. The residents at [facility] are familiar with her condition. They have a caregiver coming 1/2 day once a week, it is like she meets a new person each time. They walk daily and garden. She can sing church hymns better than him with the correct words.    History on Initial Assessment 01/27/2020: This is a pleasant 73 year old right-handed woman with a history of skin cancer, migraines, nephrolithiasis, presenting for evaluation of  memory loss and worsening migraines. She feels like memory issues do not affect her on a regular basis, and started noticing changes a few months ago. Her husband started noticing changes towards the last quarter of 2019. She is a retired IT trainer and would be able to do simple math in her head, but started using the calculator more. She started noticing that calculations she could do were not coming easily, now taking her a longer time. She used to do the books in 45 minutes, and started taking 6 hours to complete. Her husband took over 3-4 months ago. She was not missing any payments. She denies getting lost driving, leaving the stove on, misplacing things. She is not on any regular medications, she takes prn Maxalt  for migraine rescue. Her husband feels changes have been overall stable since 2019 but noticed she is working around them more. Sometimes she asks the same question, especially with time, repeatedly asking when it is time for them to leave. She has occasional word-finding issues. Her mother had memory loss consistent with dementia. Her father had a dementing illness and drug-induced Parkinson's disease but did not have memory issues. No history of significant head injuries or alcohol use.  She started having migraines in her 33s, with pressure over the right temporal region majority of the time. Occasionally she has left temporal pain. No associated nausea/vomiting, photo/phonophobia, visual obscurations. She takes prn Maxalt  with good effect. She was having migraines more frequently last May, but this has quieted down, she does not need to  take Maxalt  every month. She denies any dizziness, diplopia, dysarthria/dysphagia, neck/back pain, focal numbness/tingling/weakness, bowel/bladder dysfunction, anosmia, or tremors. Mood is good. No paranoia or hallucinations, no personality changes. Sleep is good. She retired in January 2018.  I personally reviewed MRI brain without contrast done 11/2019 which did not  show any acute changes. There was mild diffuse atrophy and a few punctate foci of T2 hyperintensity, within normal limits for age.   Neuropsychological evaluation in 02/2020 indicated Alzheimer's dementia without behavioral disturbance, MCI level of function.   Laboratory Data: Lab Results  Component Value Date   TSH 2.04 10/21/2019   Lab Results  Component Value Date   VITAMINB12 328 10/21/2019    PAST MEDICAL HISTORY: Past Medical History:  Diagnosis Date   Allergy    Alzheimer's dementia (HCC)    Cancer (HCC)    basal cell and squamous cell skin cancer   History of skin cancer    squamous and basal x 8 in 2018. Dr. Tricia. Had been with Dr. Helga.    Kidney stone    x1   Migraines    Aura- smell, spots. once a month- sumatriptan. topamax  50mg  once a day prophylaxis.    Osteopenia     MEDICATIONS: Current Outpatient Medications on File Prior to Visit  Medication Sig Dispense Refill   memantine  (NAMENDA ) 10 MG tablet TAKE 1 TABLET BY MOUTH 2 TIMES A DAY 180 tablet 3   Multiple Vitamin (MULTIVITAMIN) tablet Take 1 tablet by mouth daily.     ondansetron  (ZOFRAN -ODT) 4 MG disintegrating tablet DISSOLVE 1 TABLET BY MOUTH EVERY 8 HOURS AS NEEDED FOR NAUSEA AND/OR VOMITING 30 tablet 1   rizatriptan  (MAXALT ) 10 MG tablet TAKE 1 TABLET BY MOUTH AS NEEDED FOR MIGRAINE. MAY REPEAT IN 2 HOURS IF NEEDED 10 tablet 11   No current facility-administered medications on file prior to visit.    ALLERGIES: No Known Allergies  FAMILY HISTORY: Family History  Problem Relation Age of Onset   Hypertension Mother    Stroke Mother        TIAs- declined over years and stroke eventually. died 71   Other Father        decline after ? heat stroke. died around 61   Stroke Sister        3. nonsmoker.    Ulcerative colitis Daughter    Colon cancer Neg Hx    Colon polyps Neg Hx    Esophageal cancer Neg Hx    Stomach cancer Neg Hx    Rectal cancer Neg Hx     SOCIAL HISTORY: Social  History   Socioeconomic History   Marital status: Married    Spouse name: Not on file   Number of children: Not on file   Years of education: Not on file   Highest education level: Not on file  Occupational History   Occupation: retired  Tobacco Use   Smoking status: Never   Smokeless tobacco: Never  Vaping Use   Vaping status: Never Used  Substance and Sexual Activity   Alcohol use: No    Alcohol/week: 0.0 standard drinks of alcohol   Drug use: No   Sexual activity: Yes  Other Topics Concern   Not on file  Social History Narrative   Married over 40 years in 2018. 1 daughter 33 years old in 2023. 1 granddaughter born June 2023.    Had to spend great deal of time caring for aging parents- now passed.  Undergrad at [employer].    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 [employer]      Hobbies: travel   Right Handed   One Story Home   Does drink caffeine occasionally   Social Drivers of Health   Financial Resource Strain: Low Risk  (05/28/2023)   Overall Financial Resource Strain (CARDIA)    Difficulty of Paying Living Expenses: Not hard at all  Food Insecurity: No Food Insecurity (05/28/2023)   Hunger Vital Sign    Worried About Running Out of Food in the Last Year: Never true    Ran Out of Food in the Last Year: Never true  Transportation Needs: No Transportation Needs (05/28/2023)   PRAPARE - Administrator, Civil Service (Medical): No    Lack of Transportation (Non-Medical): No  Physical Activity: Sufficiently Active (05/28/2023)   Exercise Vital Sign    Days of Exercise per Week: 5 days    Minutes of Exercise per Session: 30 min  Stress: No Stress Concern Present (05/28/2023)   [employer] of Occupational Health - Occupational Stress Questionnaire    Feeling of Stress : Not at all  Social Connections: Moderately Integrated (05/28/2023)   Social Connection and Isolation Panel    Frequency of Communication with Friends and Family: More  than three times a week    Frequency of Social Gatherings with Friends and Family: More than three times a week    Attends Religious Services: More than 4 times per year    Active Member of [employer] or Organizations: No    Attends Banker Meetings: Never    Marital Status: Married  Catering manager Violence: Not At Risk (05/28/2023)   Humiliation, Afraid, Rape, and Kick questionnaire    Fear of Current or Ex-Partner: No    Emotionally Abused: No    Physically Abused: No    Sexually Abused: No     PHYSICAL EXAM: Vitals:   04/01/24 0818  BP: 137/80  Pulse: 81  SpO2: 96%   General: No acute distress Head:  Normocephalic/atraumatic Skin/Extremities: No rash, no edema Neurological Exam: alert and awake. There is no spontaneous speech, she is fluent but with minimal speech output, looking mostly to her husband. She has difficulty following instructions to touch her nose. She is unable to read a phrase or describe a picture, unable to count fingers. Cranial nerves: Pupils equal, round. Extraocular movements intact with no nystagmus. Visual fields appear full.  No facial asymmetry.  Motor: Bulk and tone normal, muscle strength 5/5 throughout with no pronator drift.   Finger to nose testing intact.  Gait narrow-based and steady, no ataxia. No tremors.    IMPRESSION: This is a pleasant 73 yo RH woman with a history of skin cancer, migraines, nephrolithiasis, migraines, with Alzheimer's dementia without behavioral disturbance. There is continued progression, she is again noted to have visuospatial deficits and paucity of speech. No behavioral changes. We discussed continued regular exercise, social interactions, they will look into group singing at [facility]. Continue Memantine  10mg  BID. Her husband would like to stay in Independent Living and is considering increasing help at home to several times a week, which I also encouraged. Follow-up in 6 months, call for any changes.   Thank you  for allowing me to participate in her care.  Please do not hesitate to call for any questions or concerns.    Darice Shivers, M.D.   CC: Dr. Katrinka

## 2024-04-14 DIAGNOSIS — Z23 Encounter for immunization: Secondary | ICD-10-CM | POA: Diagnosis not present

## 2024-04-15 ENCOUNTER — Encounter: Payer: Self-pay | Admitting: Family Medicine

## 2024-05-05 ENCOUNTER — Encounter: Payer: Self-pay | Admitting: Neurology

## 2024-10-27 ENCOUNTER — Ambulatory Visit: Admitting: Neurology
